# Patient Record
Sex: Female | Born: 1940
Health system: Southern US, Community
[De-identification: ages and names within clinical notes are randomized; demographics above are authoritative.]

## PROBLEM LIST (undated history)

## (undated) DIAGNOSIS — E785 Hyperlipidemia, unspecified: Secondary | ICD-10-CM

## (undated) DIAGNOSIS — K219 Gastro-esophageal reflux disease without esophagitis: Secondary | ICD-10-CM

## (undated) DIAGNOSIS — F32A Depression, unspecified: Secondary | ICD-10-CM

## (undated) DIAGNOSIS — I10 Essential (primary) hypertension: Secondary | ICD-10-CM

## (undated) DIAGNOSIS — I341 Nonrheumatic mitral (valve) prolapse: Secondary | ICD-10-CM

## (undated) DIAGNOSIS — F0390 Unspecified dementia without behavioral disturbance: Secondary | ICD-10-CM

## (undated) DIAGNOSIS — F329 Major depressive disorder, single episode, unspecified: Secondary | ICD-10-CM

## (undated) DIAGNOSIS — Z8489 Family history of other specified conditions: Secondary | ICD-10-CM

## (undated) HISTORY — PX: TONSILLECTOMY: SUR1361

## (undated) HISTORY — PX: CHOLECYSTECTOMY: SHX55

## (undated) HISTORY — PX: ABDOMINAL HYSTERECTOMY: SHX81

---

## 1998-10-22 ENCOUNTER — Other Ambulatory Visit: Admission: RE | Admit: 1998-10-22 | Discharge: 1998-10-22 | Payer: Self-pay | Admitting: Gynecology

## 1999-10-15 ENCOUNTER — Other Ambulatory Visit: Admission: RE | Admit: 1999-10-15 | Discharge: 1999-10-15 | Payer: Self-pay | Admitting: Gynecology

## 1999-12-07 ENCOUNTER — Encounter: Payer: Self-pay | Admitting: Gynecology

## 1999-12-07 ENCOUNTER — Encounter: Admission: RE | Admit: 1999-12-07 | Discharge: 1999-12-07 | Payer: Self-pay | Admitting: Gynecology

## 2000-11-03 ENCOUNTER — Other Ambulatory Visit: Admission: RE | Admit: 2000-11-03 | Discharge: 2000-11-03 | Payer: Self-pay | Admitting: Gynecology

## 2000-12-07 ENCOUNTER — Encounter: Admission: RE | Admit: 2000-12-07 | Discharge: 2000-12-07 | Payer: Self-pay | Admitting: Gynecology

## 2000-12-07 ENCOUNTER — Encounter: Payer: Self-pay | Admitting: Gynecology

## 2000-12-30 ENCOUNTER — Ambulatory Visit (HOSPITAL_COMMUNITY): Admission: RE | Admit: 2000-12-30 | Discharge: 2000-12-30 | Payer: Self-pay | Admitting: Gastroenterology

## 2001-11-08 ENCOUNTER — Other Ambulatory Visit: Admission: RE | Admit: 2001-11-08 | Discharge: 2001-11-08 | Payer: Self-pay | Admitting: Gynecology

## 2001-12-14 ENCOUNTER — Encounter: Payer: Self-pay | Admitting: Gynecology

## 2001-12-14 ENCOUNTER — Encounter: Admission: RE | Admit: 2001-12-14 | Discharge: 2001-12-14 | Payer: Self-pay | Admitting: Gynecology

## 2002-11-19 ENCOUNTER — Other Ambulatory Visit: Admission: RE | Admit: 2002-11-19 | Discharge: 2002-11-19 | Payer: Self-pay | Admitting: Gynecology

## 2002-12-28 ENCOUNTER — Encounter: Payer: Self-pay | Admitting: Gynecology

## 2002-12-28 ENCOUNTER — Encounter: Admission: RE | Admit: 2002-12-28 | Discharge: 2002-12-28 | Payer: Self-pay | Admitting: Gynecology

## 2003-12-05 ENCOUNTER — Other Ambulatory Visit: Admission: RE | Admit: 2003-12-05 | Discharge: 2003-12-05 | Payer: Self-pay | Admitting: Gynecology

## 2003-12-31 ENCOUNTER — Encounter: Admission: RE | Admit: 2003-12-31 | Discharge: 2003-12-31 | Payer: Self-pay | Admitting: Gynecology

## 2004-04-05 ENCOUNTER — Encounter: Admission: RE | Admit: 2004-04-05 | Discharge: 2004-04-05 | Payer: Self-pay | Admitting: Neurology

## 2004-04-11 ENCOUNTER — Ambulatory Visit: Payer: Self-pay | Admitting: Internal Medicine

## 2004-04-11 ENCOUNTER — Inpatient Hospital Stay (HOSPITAL_COMMUNITY): Admission: EM | Admit: 2004-04-11 | Discharge: 2004-04-13 | Payer: Self-pay | Admitting: Emergency Medicine

## 2004-04-11 ENCOUNTER — Ambulatory Visit: Payer: Self-pay | Admitting: Cardiology

## 2004-06-01 ENCOUNTER — Ambulatory Visit (HOSPITAL_COMMUNITY): Admission: RE | Admit: 2004-06-01 | Discharge: 2004-06-01 | Payer: Self-pay | Admitting: Gastroenterology

## 2004-12-08 ENCOUNTER — Other Ambulatory Visit: Admission: RE | Admit: 2004-12-08 | Discharge: 2004-12-08 | Payer: Self-pay | Admitting: Gynecology

## 2004-12-22 ENCOUNTER — Encounter: Admission: RE | Admit: 2004-12-22 | Discharge: 2004-12-22 | Payer: Self-pay | Admitting: Gynecology

## 2005-03-21 ENCOUNTER — Emergency Department (HOSPITAL_COMMUNITY): Admission: EM | Admit: 2005-03-21 | Discharge: 2005-03-21 | Payer: Self-pay | Admitting: Emergency Medicine

## 2006-04-07 ENCOUNTER — Other Ambulatory Visit: Admission: RE | Admit: 2006-04-07 | Discharge: 2006-04-07 | Payer: Self-pay | Admitting: Gynecology

## 2007-03-05 ENCOUNTER — Emergency Department (HOSPITAL_COMMUNITY): Admission: EM | Admit: 2007-03-05 | Discharge: 2007-03-05 | Payer: Self-pay | Admitting: Emergency Medicine

## 2007-04-25 ENCOUNTER — Encounter: Admission: RE | Admit: 2007-04-25 | Discharge: 2007-04-25 | Payer: Self-pay | Admitting: Gynecology

## 2007-06-27 ENCOUNTER — Encounter: Admission: RE | Admit: 2007-06-27 | Discharge: 2007-06-27 | Payer: Self-pay | Admitting: Neurology

## 2007-08-14 ENCOUNTER — Encounter: Admission: RE | Admit: 2007-08-14 | Discharge: 2007-08-14 | Payer: Self-pay | Admitting: Neurology

## 2007-10-13 ENCOUNTER — Ambulatory Visit (HOSPITAL_COMMUNITY): Admission: RE | Admit: 2007-10-13 | Discharge: 2007-10-13 | Payer: Self-pay | Admitting: Neurology

## 2008-04-26 ENCOUNTER — Encounter: Admission: RE | Admit: 2008-04-26 | Discharge: 2008-04-26 | Payer: Self-pay | Admitting: Internal Medicine

## 2009-04-04 ENCOUNTER — Emergency Department (HOSPITAL_COMMUNITY): Admission: EM | Admit: 2009-04-04 | Discharge: 2009-04-04 | Payer: Self-pay | Admitting: Family Medicine

## 2010-01-24 ENCOUNTER — Encounter: Admission: RE | Admit: 2010-01-24 | Discharge: 2010-01-24 | Payer: Self-pay | Admitting: Orthopedic Surgery

## 2010-03-29 ENCOUNTER — Encounter: Payer: Self-pay | Admitting: Internal Medicine

## 2010-07-24 NOTE — Discharge Summary (Signed)
Kristine Cox, Kristine Cox               ACCOUNT NO.:  [id number]   MEDICAL RECORD NO.:  [id number]          PATIENT TYPE:  INP   LOCATION:  3730                         FACILITY:  [facility]   PHYSICIAN:  Alvester Morin, M.D.  DATE OF BIRTH:  [birth year]   DATE OF ADMISSION:  04/11/2004  DATE OF DISCHARGE:  04/13/2004                                 DISCHARGE SUMMARY   DISCHARGE DIAGNOSES:  1.  Atypical chest pain, myocardial infarction ruled out with Cardiolite.  2.  Hypertension.  3.  Cough.  4.  Status post hysterectomy for acute __________ in 1994.  5.  Status post cholecystectomy 20 years ago.  6.  Status post tonsillectomy.   DISCHARGE MEDICATIONS:  1.  Diovan 40 mg daily.  2.  Prilosec 40 mg daily.  3.  Prozac 40 mg daily.  4.  Aspirin 325 mg daily.  5.  Tussionex 1 tablespoon twice daily.   DISPOSITION/FOLLOWUP:  [name] is discharged in stable condition with no  complaints of chest pain for 48 hours during her admission in the hospital.  She follows up with Dr. Egbert Garibaldi on Thursday, February 9th, at 1:30 p.m.  At  this time, Dr. Egbert Garibaldi is to order a CMET on her and follow up on abnormal  liver function tests, to see if they have reversed and then start her on  anticholesterol medicine because anticholesterol treatment can alter the  liver function and wanted a clear difference between the two.   PROCEDURES PERFORMED:  Patient had an EKG in the ER on the day of admission,  which showed bradycardia at normal intervals, normal axis, and no ST/T wave  changes.   Patient had a CT scan to rule out PE.   CONSULTS:  There were no consults on this patient.   HISTORY OF PRESENT ILLNESS:  [name] is a 70 year old white female with  cardiac risk factors of hypertension, age, history of remote tobacco abuse,  unknown lipid status and family history of coronary artery disease but not  at a young age, who presented to the ER with a complaint of episode of chest  pain at work.  The  pain started around 11 a.m. in her left shoulder and was  associated with diaphoresis and a feeling of passing out.  She sat herself  and was brought to the ED.  The chest pain lasted for around three hours and  disappeared after she came to the ED without any treatment.  Patient had a  similar episode two days ago when she had left-sided chest pain radiating to  her left arm, associated with diaphoresis.  Not associated with any  shortness of breath, nausea, vomiting, palpitations, or any visual changes.  Disappeared 6-7 minutes after taking two tablets of aspirin.  Patient has  had a complaint of cough for 10 days, producing white sputum.  No complaint  of fever, night sweats, loss of appetite.   LABS ON ADMISSION:  Sodium 139, potassium 4.1, chloride 104, bicarb 27, BUN  7, creatinine 0.8, glucose 113.  Hemoglobin 13.5, hematocrit 13.2, WBC 0.3,  platelets 276.  Bilirubin 0.3, alk phos 131, SGOT 103, SGPT 54, protein 6.6,  albumin 3.5, calcium 9.  Coags were normal.  UA was negative.  Urine drug  screen was positive for opiates and benzodiazepines.  Alcohol level was 3.  Cardiac enzymes x3 was normal.  D-dimer was 0.67.   ASSESSMENT/PLAN:  1.  Chest pain:  The patient has multiple cardiac risk factors, listing them      to begin with age.  Patient is menopausal.  Hypertension.      Hyperlipidemia with a family history of coronary artery disease but not      at a young age.  Remote tobacco abuse.  Patient was admitted to rule out      MI.  Cardiac enzymes x3 were negative.  EKG was normal to begin with.      The patient did get a cardiac consult by Dr. Jens Som, who thought it      was needed to work her up with a Cardiolite stress test.  She got the      Cardiolite stress test on February 6th, which was normal.  Final films      are pending at this time.  Patient did not have any episode of chest      pain during her entire admission in the hospital; hence, patient was      discharged  with a follow-up appointment with her primary doctor.  2.  Hypertension:  The patient's blood pressure was normal during her entire      stay on Diovan 40 mg daily.  3.  Hyperlipidemia:  Patient came in with an unknown lipid status but on      admission, her cholesterol was 195, HDL 27, triglycerides 316, LDL 105.      Patient definitely needs anticholesterol treatment.  Niacin would be      best for her, since her HDL is so low and triglycerides is elevated, but      with her abnormal liver functions, I did not find it right to start her      on antilipid treatment, which could itself alter liver functions.  My      plan is for the patient to follow up with her primary doctor, do a      repeat CMET, find out the condition of her liver enzymes, and start      antilipid treatment after that.  4.  Abnormal liver function tests:  The patient has OT/PT split with SGOT      103 and SGPT 54, alk phos 131.  Her alcohol level on admission was 3.      So this is an abnormal liver function test with split in OT/PT most      likely is alcohol in origin.  Did go ahead and order hepatitis panel,      which has been negative.  Will follow up on that and inform Dr. Egbert Garibaldi      about this.  5.  Depression:  Patient is on Prozac 40 daily.  6.  Gastroesophageal reflux disease:  Patient is on Prilosec 40 mg daily.   LABS ON DISCHARGE:  Hemoglobin 13.1, WBC 7.7, TSH 1.085.   Patient is discharged in stable condition.      SP/MEDQ  D:  04/13/2004  T:  04/13/2004  Job:  [id number]   cc:   Dr. Dulce Sellar, cardiologist   Steele Berg Phifer, M.D.  [address]  [place]  [place] [address]  Fax: [phone]  Cheri Rous  [address]  [place]  [place] [address]  Fax: [phone]

## 2010-07-24 NOTE — Procedures (Signed)
[facility]  Patient:    Kristine Cox, Kristine Cox Visit Number: [id number] MRN: [id number]          Service Type: END Location: ENDO Attending Physician:  Rich Brave Proc. Date: 12/30/00 Admit Date:  12/30/2000 Discharge Date: 12/30/2000   CC:         Leroy Sea., M.D.   Procedure Report  PROCEDURE:  Colonoscopy.  INDICATION:  Prior history of colonic adenomas, plus a family history of colon cancer, in this 70 year old white female last colonoscoped five years ago.  FINDINGS:  Small vascular malformation in the cecum.  DESCRIPTION OF PROCEDURE:  The nature, purpose, and risks of the procedure were familiar to the patient from prior examinations, and she provided written consent.  Sedation for this procedure and the upper endoscopy which preceded it totaled fentanyl 75 mcg and Versed 5 mg IV without arrhythmias or desaturation.  The Olympus adult video colonoscope was advanced without too much difficulty, just a little bit of looping, to the cecum, and pullback was then performed.  At the base of the cecum was a 2-3 mm vascular malformation which was not bleeding.  This was otherwise a normal examination.  No polyps, cancer, colitis, or diverticular disease were observed. Retroflexion showed moderate internal hemorrhoids.  No biopsies were obtained.  The patient tolerated the procedure well, and there were no apparent complications.  IMPRESSION:  Small cecal vascular malformation, otherwise normal exam in a patient with a family history of colon cancer and prior history of colonic adenomas having been removed.  PLAN:  Follow-up colonoscopy five years. Attending Physician:  Rich Brave DD:  12/30/00 TD:  01/01/01 Job: [id number]

## 2010-07-24 NOTE — Consult Note (Signed)
Kristine Cox, Kristine Cox               ACCOUNT NO.:  [id number]   MEDICAL RECORD NO.:  [id number]          PATIENT TYPE:  INP   LOCATION:  3733                         FACILITY:  [facility]   PHYSICIAN:  Olga Millers, M.D.   DATE OF BIRTH:  Nov 17, 1940   DATE OF CONSULTATION:  04/12/2004  DATE OF DISCHARGE:                                   CONSULTATION   SUMMARY OF HISTORY:  Kristine Cox is a 70 year old white female who was  transported via EMS to [facility] Room secondary to chest  discomfort and admitted by [facility] and we were asked to consult  in regards to chest discomfort.  Kristine Cox states that Friday around 6 p.m.  while she was resting to get ready for her third shift at work, she states  that suddenly she felt faint.  This was followed by profuse diaphoresis with  left arm aching, radiating into her left shoulder and chest unassociated  with shortness of breath, nausea or vomiting.  She was unable to rest and  could not get comfortable.  She states her discomfort was a 10 on a scale of  0-10.  She called to her husband and took two aspirin and the discomfort  eased within 10 minutes.  She had residual fatigue.  She worked Friday night  without difficulty and was able to rest Saturday.  She stated that she felt  okay throughout Saturday and Friday evening, but felt strange.  On  Saturday evening after about an hour being at work, she developed the same  symptoms.  She saw the EMT who took her to a nursing station and she again  took two aspirin with improvement of her discomfort.  They called EMS for  transportation.  On EMS' arrival, blood pressure was 112/66, pulse 64,  respirations 16 and in sinus rhythm.  She was placed on the monitor and O2  and transported.   Since admission, enzymes and EKGs have been negative for a myocardial  infarction.  Her hemoglobin is 13.5 and hematocrit 39.2, platelets 276,000,  WBC is 8.3.  Sodium 139, potassium 4.1, BUN 7,  creatinine 0.8, glucose 101.  Her AST, ALT and alkaline phosphatase were all elevated at 103, 54, and 131,  respectively.  BNP was normal at 72.3, D-dimer was elevated at 0.67 and a  chest CT was negative for pulmonary embolism.  Chest x-ray shows bilateral  basilar atelectasis.  Drug screen was positive for opiates, ETOH level was  3.   Kristine Cox describes prior occurrences she thinks within the last year but  she cannot be specific because of the infrequency.  She does recall one  episode and she attributes this specifically as to needing something to  eat.  She also notes that she was recently treated for bronchitis by her  primary care physician approximately 2 weeks ago.  She states that she  received a Phenergan shot, an inhaler and a cough medication.   ALLERGIES:  NO KNOWN DRUG ALLERGIES.   MEDICATIONS PRIOR TO ADMISSION:  1.  Diovan 160 mg daily.  2.  Prilosec  20 mg daily.  3.  Prozac 40 mg daily.  4.  Aspirin 81 mg daily.  5.  Excedrin p.r.n.  6.  __________  25 mg two times a week.  7.  Vivelle dot 0.05 mg two times a week.   Her history is notable for hypertension.  She states she thinks it was in  the 1970s when she was diagnosed with mitral valve prolapse.  She had an  echocardiogram at that time in [place].  When she moved here, she followed  up with Dr. Alanda Amass and then when she moved to [place] with Dr. Dulce Sellar.  Since her initial evaluation in the 70s, she has not had any follow-up  echocardiograms and she denies any history of stress test or cardiac  catheterizations.  She denies any history of diabetes, myocardial  infarction, CVA, COPD, bleeding dyscrasias, thyroid dysfunction, she does  not know her cholesterol status.  Last colonoscopy was in October 2002, she  had a small cecal vascular malformation.  EGD in October 2002 showed GERD.   SURGICAL HISTORY:  Notable for a hysterectomy in 1994, bilateral tubal  ligation, cholecystectomy, T&A, bladder  tack.   SOCIAL HISTORY:  She resides in [place] with her husband.  She has been  employed by [employer] for the last 18 years and is a Scientist, clinical (histocompatibility and immunogenetics).  She has two daughters alive and well.  She quit smoking 17  years ago, prior to that one pack per day for 19 years.  She denies any  alcohol, drugs, herbal medications, specific diet, or regular exercise.   FAMILY HISTORY:  Notable for the death of her mother at the age of 44 with  melanoma, her father died at the age of 96 with CVA and history of coronary  artery disease.  She has 11 brothers and sisters, three of whom have  diabetes, several have died secondary to different types of cancer.  She has  one brother in his 61s who is post bypass surgery.   REVIEW OF SYSTEMS:  Notable for occasional chills, upper dentures, glasses,  cough productive of yellow sputum, stress incontinence, menopause, chronic  vomiting, GERD symptoms.   CURRENT MEDICATIONS:  1.  Aspirin 325 mg daily.  2.  Prozac 40 mg daily.  3.  Protonix 40 mg daily.  4.  Lovenox 40 mg daily.  5.  Diovan 40 mg daily.  6.  Tussionex b.i.d.   PHYSICAL EXAMINATION:  GENERAL:  Well-developed, well-nourished pleasant  white female in no apparent distress.  During our interview and on exam she  is frequently coughing.  VITAL SIGNS:  Temperature 98, blood pressure 116/62, pulse 76, respirations  20.  HEENT:  Unremarkable except for upper dentures.  NECK:  Supple, without thyromegaly, adenopathy, JVD or carotid bruits.  CHEST:  Symmetrical excursion.  Lung sounds were diminished but clear to  auscultation.  ABDOMEN:  Obese, bowel sounds present, without organomegaly, masses or  tenderness.  EXTREMITIES:  Negative clubbing, cyanosis or edema.  All peripheral pulses  were symmetrical and intact without femoral or abdominal bruits.  NEUROLOGIC:  Unremarkable.  SKIN/INTEGUMENT:  Intact.   IMPRESSION: 1.  Atypical chest discomfort.  She has ruled out for  a myocardial      infarction by enzymes and electrocardiograms.  2.  Recent bronchitis.  3.  Elevated liver function tests, unknown etiology.  4.  History as previously mentioned.   RECOMMENDATIONS:  Dr. Jens Som reviewed the patient's history, physical exam  and medications.  He agrees that the discomfort is somewhat atypical and  would recommend a stress Cardiolite in the morning prior to dismissal from  the hospital.  If her Myoview is negative, she may be discharged home from  our standpoint and to follow up with Dr. Dulce Sellar at discharge.  She does need  evaluation of her elevated LFTs.      EW/MEDQ  D:  04/12/2004  T:  04/13/2004  Job:  [id number]   cc:   Cheri Rous  [address] [place]  [place] [address]  Fax: [phone]   Dr. Dulce Sellar  [facility]

## 2010-07-24 NOTE — Procedures (Signed)
[facility]  Patient:    Kristine Cox, Kristine Cox Visit Number: [id number] MRN: [id number]          Service Type: END Location: ENDO Attending Physician:  Rich Brave Proc. Date: 12/30/00 Admit Date:  12/30/2000 Discharge Date: 12/30/2000   CC:         Leroy Sea., M.D.   Procedure Report  PROCEDURE:  Upper endoscopy.  INDICATION:  A 70 year old female with recurrent reflux symptoms, responding to Prilosec.  Last endoscopy was about 10 years ago.  FINDINGS:  No evidence of Barretts.  Slight bile reflux and gastric erythema.  DESCRIPTION OF PROCEDURE:  The nature, purpose, and risks of the procedure were familiar to the patient from prior examination, and she provided written consent.  Sedation was fentanyl 50 mcg and Versed 4 mg IV without arrhythmias or desaturation.  The Olympus video endoscope was passed under direct vision. The vocal cords looked completely normal.  The esophagus was readily entered and had completely normal mucosa down to the squamocolumnar junction, without evidence of free gastroesophageal reflux, Barretts esophagus, reflux esophagitis, varices, infection, or neoplasia.  No ring, stricture, or hiatal hernia was appreciated.  The stomach was entered.  It contained a small amount of bile, and the gastric mucosa was slightly erythematous and pink, although there was no florid gastritis and no erosions, ulcers, polyps, or masses were observed including retroflexed view of the proximal stomach.  The pylorus, duodenal bulb, and second duodenum looked normal.  The scope was removed from the patient who tolerated the procedure well and without apparent complication.  No biopsies were obtained.  IMPRESSION: 1. No evidence of adverse sequela of reflux symptoms such as Barretts    esophagus, stricture, or reflux esophagitis. 2. Slight bile reflux and possible bile reflux gastritis.  PLAN:  Continue Prilosec since  the patient is asymptomatic on it.  No follow-up endoscopy is anticipated. Attending Physician:  Rich Brave DD:  12/30/00 TD:  01/01/01 Job: [id number]

## 2010-07-24 NOTE — Op Note (Signed)
Kristine Cox, Kristine Cox               ACCOUNT NO.:  [id number]   MEDICAL RECORD NO.:  [id number]          PATIENT TYPE:  AMB   LOCATION:  ENDO                         FACILITY:  [facility]   PHYSICIAN:  Bernette Redbird, M.D.   DATE OF BIRTH:  08/06/1940   DATE OF PROCEDURE:  DATE OF DISCHARGE:                                 OPERATIVE REPORT   PROCEDURE:  Upper endoscopy.   INDICATIONS:  A 70 year old with recurring chest pain of apparent noncardiac  origin.   FINDINGS:  Normal exam.   PROCEDURE:  The patient provided written consent for the procedure. Sedation  was fentanyl 50 mcg and Versed 5 mg IV without arrhythmias or desaturation.  The Olympus small-caliber adult video endoscope was passed under direct  vision, entering the esophagus quite easily.   The esophagus was endoscopically normal. I did not see any reflux  esophagitis, Barrett's esophagus, varices, infection, neoplasia, ring,  stricture, or hiatal hernia. I did not see any obvious motility disturbance  to suggest esophageal spasm.   The stomach was entered. It contained no significant residual. I saw no  evidence of gastritis, erosions, ulcers, polyps, masses, or other  abnormalities including a retroflex view of the cardia. The pylorus,  duodenal bulb, and second duodenum looked normal.   The scope was removed from the patient who tolerated the procedure well; and  there no apparent complications. No biopsies were obtained.   IMPRESSION:  Normal endoscopy. No source of chest pain endoscopically  evident.   PLAN:  I will continue PPI therapy for now. She is currently taking double  dose, and this is probably reasonable to continue for the next couple of  months, after which I would probably try stepping it down to once daily  dosing.      RB/MEDQ  D:  06/01/2004  T:  06/01/2004  Job:  [id number]   cc:   Norman Herrlich, M.D.  [address]  [place]   [place]  [address]

## 2010-12-11 LAB — I-STAT 8, (EC8 V) (CONVERTED LAB)
BUN: 16
Bicarbonate: 26.6 — ABNORMAL HIGH
Glucose, Bld: 123 — ABNORMAL HIGH
Hemoglobin: 16 — ABNORMAL HIGH
Potassium: 4.5
Sodium: 140

## 2010-12-11 LAB — POCT I-STAT CREATININE: Creatinine, Ser: 1

## 2012-02-29 ENCOUNTER — Emergency Department (HOSPITAL_COMMUNITY)
Admission: EM | Admit: 2012-02-29 | Discharge: 2012-02-29 | Disposition: A | Discharge status: Home / Self Care | Payer: Medicare HMO | Attending: Emergency Medicine | Admitting: Emergency Medicine

## 2012-02-29 ENCOUNTER — Emergency Department (HOSPITAL_COMMUNITY): Payer: Medicare HMO

## 2012-02-29 ENCOUNTER — Encounter (HOSPITAL_COMMUNITY): Payer: Self-pay | Admitting: Emergency Medicine

## 2012-02-29 DIAGNOSIS — F3289 Other specified depressive episodes: Secondary | ICD-10-CM | POA: Insufficient documentation

## 2012-02-29 DIAGNOSIS — S79919A Unspecified injury of unspecified hip, initial encounter: Secondary | ICD-10-CM | POA: Insufficient documentation

## 2012-02-29 DIAGNOSIS — E785 Hyperlipidemia, unspecified: Secondary | ICD-10-CM | POA: Insufficient documentation

## 2012-02-29 DIAGNOSIS — F0390 Unspecified dementia without behavioral disturbance: Secondary | ICD-10-CM | POA: Insufficient documentation

## 2012-02-29 DIAGNOSIS — S82899A Other fracture of unspecified lower leg, initial encounter for closed fracture: Secondary | ICD-10-CM | POA: Insufficient documentation

## 2012-02-29 DIAGNOSIS — Y929 Unspecified place or not applicable: Secondary | ICD-10-CM | POA: Insufficient documentation

## 2012-02-29 DIAGNOSIS — F329 Major depressive disorder, single episode, unspecified: Secondary | ICD-10-CM | POA: Insufficient documentation

## 2012-02-29 DIAGNOSIS — Z79899 Other long term (current) drug therapy: Secondary | ICD-10-CM | POA: Insufficient documentation

## 2012-02-29 DIAGNOSIS — I10 Essential (primary) hypertension: Secondary | ICD-10-CM | POA: Insufficient documentation

## 2012-02-29 DIAGNOSIS — W19XXXA Unspecified fall, initial encounter: Secondary | ICD-10-CM | POA: Insufficient documentation

## 2012-02-29 DIAGNOSIS — Y939 Activity, unspecified: Secondary | ICD-10-CM | POA: Insufficient documentation

## 2012-02-29 DIAGNOSIS — S0990XA Unspecified injury of head, initial encounter: Secondary | ICD-10-CM | POA: Insufficient documentation

## 2012-02-29 DIAGNOSIS — Z7982 Long term (current) use of aspirin: Secondary | ICD-10-CM | POA: Insufficient documentation

## 2012-02-29 HISTORY — DX: Hyperlipidemia, unspecified: E78.5

## 2012-02-29 HISTORY — DX: Depression, unspecified: F32.A

## 2012-02-29 HISTORY — DX: Essential (primary) hypertension: I10

## 2012-02-29 HISTORY — DX: Major depressive disorder, single episode, unspecified: F32.9

## 2012-02-29 NOTE — ED Provider Notes (Signed)
History     CSN: [id number]  Arrival date & time 02/29/12  1122   First MD Initiated Contact with Patient 02/29/12 1128      Chief Complaint  Patient presents with  . Ankle Pain    (Consider location/radiation/quality/duration/timing/severity/associated sxs/prior treatment) HPI 71 year old female presents the emergency department for ankle fracture.  Patient was seen and evaluated at [facility] last Friday after a fall.  She's unsure of the fracture but does know that she needs ORIF.  She has been evaluated in outpatient followup by Dr. Carola Frost.  Patient removed her ankle splint in her sleep last night.  She also has left hip pain and headache.  Her daughter states she's had increased confusion on top of her baseline dementia.  Patient does not remember hitting her head or losing consciousness but she does have a small bruise on the posterior occiput.  She did not have CT of the head or left hip x-ray.  Denies fevers, chills, myalgias, arthralgias. Denies DOE, SOB, chest tightness or pressure, radiation to left arm, jaw or back, or diaphoresis. Denies dysuria, flank pain, suprapubic pain, frequency, urgency, or hematuria. Denies headaches, light headedness, weakness, visual disturbances. Denies abdominal pain, nausea, vomiting, diarrhea or constipation.   Past Medical History  Diagnosis Date  . Hypertension   . Depression   . Hyperlipidemia     Past Surgical History  Procedure Date  . Tonsillectomy   . Abdominal hysterectomy   . Cholecystectomy     No family history on file.  History  Substance Use Topics  . Smoking status: Former Games developer  . Smokeless tobacco: Not on file  . Alcohol Use: No    OB History    Grav Para Term Preterm Abortions TAB SAB Ect Mult Living                  Review of Systems Ten systems reviewed and are negative for acute change, except as noted in the HPI.   Allergies  Review of patient's allergies indicates no known  allergies.  Home Medications   Current Outpatient Rx  Name  Route  Sig  Dispense  Refill  . ASPIRIN EC 81 MG PO TBEC   Oral   Take 81 mg by mouth daily.         . DONEPEZIL HCL 10 MG PO TABS   Oral   Take 10 mg by mouth daily.         . FENOFIBRATE 145 MG PO TABS   Oral   Take 145 mg by mouth daily.         Marland Kitchen FLUOXETINE HCL 20 MG PO CAPS   Oral   Take 20 mg by mouth daily.         . OXYCODONE-ACETAMINOPHEN 10-325 MG PO TABS   Oral   Take 1 tablet by mouth every 4 (four) hours as needed. For pain         . PRAVASTATIN SODIUM 40 MG PO TABS   Oral   Take 40 mg by mouth daily.           BP 130/67  Pulse 68  Temp 98.3 F (36.8 C) (Oral)  Resp 18  SpO2 95%  Physical Exam  Constitutional: She is oriented to person, place, and time. She appears well-developed and well-nourished. No distress.  HENT:  Head: Normocephalic and atraumatic.  Eyes: Conjunctivae normal are normal. No scleral icterus.  Neck: Normal range of motion.  Cardiovascular: Normal rate, regular rhythm and  normal heart sounds.  Exam reveals no gallop and no friction rub.   No murmur heard. Pulmonary/Chest: Effort normal and breath sounds normal. No respiratory distress.  Abdominal: Soft. Bowel sounds are normal. She exhibits no distension and no mass. There is no tenderness. There is no guarding.  Musculoskeletal:       TTP left hip and glute. Patient has left foot and ankle bruising and swelling.  There is bruising to the anterior shin.  Neurovascularly intact  Neurological: She is alert and oriented to person, place, and time.  Skin: Skin is warm and dry. She is not diaphoretic.    ED Course  Procedures (including critical care time)  Labs Reviewed - No data to display Dg Hip Complete Left  02/29/2012  *RADIOLOGY REPORT*  Clinical Data: Fall 4 days ago back pain, hip pain  LEFT HIP - COMPLETE 2+ VIEW  Comparison: None.  Findings: Three views of the left hip submitted.  No acute  fracture or subluxation.  Diffuse osteopenia.  IMPRESSION: No acute fracture or subluxation.   Original Report Authenticated By: Natasha Mead, M.D.    Ct Head Wo Contrast  02/29/2012  *RADIOLOGY REPORT*  Clinical Data:  Memory loss, recent falls  CT HEAD WITHOUT CONTRAST  Technique:  Contiguous axial images were obtained from the base of the skull through the vertex without contrast  Comparison:  06/27/2007  Findings:  The brain has a normal appearance without evidence for hemorrhage, acute infarction, hydrocephalus, or mass lesion.  There is no extra axial fluid collection.  The skull and paranasal sinuses are normal.  IMPRESSION: Normal CT of the head without contrast.   Original Report Authenticated By: Judie Petit. Shick, M.D.      1. Ankle fracture   2. Fall       MDM  3:09 PM BP 130/67  Pulse 68  Temp 98.3 F (36.8 C) (Oral)  Resp 18  SpO2 95% Patient CT of the head and left hip x-ray are negative.  We'll discharge the patient with new splint to the left foot.  Patient is unable to use crutches and has walker at home.  I have advised the family that they should call Dr. Carola Frost who is managing the patient's orthopedic issues.  If they need more assistance with ambulation.  Patient has pain medication.  I have advised him not to give Tylenol with Percocet.  He may use Advil and supportive care. Discussed reasons to seek immediate care. Patient expresses understanding and agrees with plan.        Arthor Captain, PA-C 02/29/12 1510

## 2012-02-29 NOTE — ED Notes (Signed)
Pt presenting to ed with c/o falling and fracturing left ankle x 4 days ago. Pt received splint cast on leg yesterday at which pt took splint off in the middle of last night and now swelling and pain is worse. Pt also with c/o left buttock pain. Pt states she was originally seen at [facility]. Pt's daughter states pt has also been complaining of head pain.

## 2012-02-29 NOTE — ED Provider Notes (Signed)
Medical screening examination/treatment/procedure(s) were performed by non-physician practitioner and as supervising physician I was immediately available for consultation/collaboration.   Alvia Tory B. Bernette Mayers, MD 02/29/12 [phone]

## 2012-03-15 ENCOUNTER — Encounter (HOSPITAL_COMMUNITY): Payer: Self-pay | Admitting: Pharmacy Technician

## 2012-03-16 ENCOUNTER — Other Ambulatory Visit: Payer: Self-pay | Admitting: Orthopedic Surgery

## 2012-03-20 ENCOUNTER — Encounter (HOSPITAL_COMMUNITY)
Admission: RE | Admit: 2012-03-20 | Discharge: 2012-03-20 | Disposition: A | Discharge status: Home / Self Care | Payer: Medicare HMO | Source: Ambulatory Visit | Attending: Orthopedic Surgery | Admitting: Orthopedic Surgery

## 2012-03-20 ENCOUNTER — Encounter (HOSPITAL_COMMUNITY): Payer: Self-pay

## 2012-03-20 LAB — URINALYSIS, ROUTINE W REFLEX MICROSCOPIC
Nitrite: NEGATIVE
Specific Gravity, Urine: 1.005 (ref 1.005–1.030)
Urobilinogen, UA: 0.2 mg/dL (ref 0.0–1.0)
pH: 6.5 (ref 5.0–8.0)

## 2012-03-20 LAB — SURGICAL PCR SCREEN
MRSA, PCR: NEGATIVE
Staphylococcus aureus: NEGATIVE

## 2012-03-20 LAB — URINE MICROSCOPIC-ADD ON

## 2012-03-20 LAB — CBC
Platelets: 351 10*3/uL (ref 150–400)
RBC: 4.76 MIL/uL (ref 3.87–5.11)
RDW: 13.7 % (ref 11.5–15.5)
WBC: 9.3 10*3/uL (ref 4.0–10.5)

## 2012-03-20 LAB — BASIC METABOLIC PANEL
Calcium: 9.7 mg/dL (ref 8.4–10.5)
Creatinine, Ser: 0.65 mg/dL (ref 0.50–1.10)
GFR calc Af Amer: >90 mL/min (ref >90)
Sodium: 139 mEq/L (ref 135–145)

## 2012-03-20 MED ORDER — CHLORHEXIDINE GLUCONATE 4 % EX LIQD: 60.0000 mL | Freq: Once | CUTANEOUS | Status: DC

## 2012-03-20 MED ORDER — CEFAZOLIN SODIUM-DEXTROSE 2-3 GM-% IV SOLR
2.0000 g | INTRAVENOUS | Status: AC
  Administered 2012-03-21: 2 g via INTRAVENOUS
  Filled 2012-03-20: qty 50

## 2012-03-20 NOTE — Progress Notes (Signed)
Kristine Cox at Dr August Saucer 's office made aware of abnormal urine specimen, stated she would inform Dr August Saucer.

## 2012-03-20 NOTE — Pre-Procedure Instructions (Signed)
Kristine Cox  03/20/2012   Your procedure is scheduled on:  Tuesday March 21, 2012  Report to [facility] at 0845 AM.  Call this number if you have problems the morning of surgery: [phone]   Remember:   Do not eat food or drink liquids after midnight.   Take these medicines the morning of surgery with A SIP OF WATER: Donepezil[ Aricept], Fluoxetine[Prozac] and Oxycodone[Percocet] if needed   Do not wear jewelry, make-up or nail polish.  Do not wear lotions, powders, or perfumes. You may wear deodorant.  Do not shave 48 hours prior to surgery.   Do not bring valuables to the hospital.  Contacts, dentures or bridgework may not be worn into surgery.  Leave suitcase in the car. After surgery it may be brought to your room.  For patients admitted to the hospital, checkout time is 11:00 AM the day of  discharge.   Patients discharged the day of surgery will not be allowed to drive  home.  Name and phone number of your driver:   Special Instructions: Shower using CHG 2 nights before surgery and the night before surgery.  If you shower the day of surgery use CHG.  Use special wash - you have one bottle of CHG for all showers.  You should use approximately 1/3 of the bottle for each shower.   Please read over the following fact sheets that you were given: Pain Booklet, Coughing and Deep Breathing, MRSA Information and Surgical Site Infection Prevention

## 2012-03-21 ENCOUNTER — Ambulatory Visit (HOSPITAL_COMMUNITY): Payer: Medicare HMO | Admitting: Anesthesiology

## 2012-03-21 ENCOUNTER — Encounter (HOSPITAL_COMMUNITY): Payer: Self-pay | Admitting: Anesthesiology

## 2012-03-21 ENCOUNTER — Observation Stay (HOSPITAL_COMMUNITY)
Admission: RE | Admit: 2012-03-21 | Discharge: 2012-03-23 | Disposition: A | Discharge status: Home / Self Care | Payer: Medicare HMO | Source: Ambulatory Visit | Attending: Orthopedic Surgery | Admitting: Orthopedic Surgery

## 2012-03-21 ENCOUNTER — Encounter (HOSPITAL_COMMUNITY): Payer: Self-pay | Admitting: General Practice

## 2012-03-21 ENCOUNTER — Ambulatory Visit (HOSPITAL_COMMUNITY): Payer: Medicare HMO

## 2012-03-21 ENCOUNTER — Observation Stay (HOSPITAL_COMMUNITY): Payer: Medicare HMO

## 2012-03-21 ENCOUNTER — Encounter (HOSPITAL_COMMUNITY)
Admission: RE | Disposition: A | Discharge status: Home / Self Care | Payer: Self-pay | Source: Ambulatory Visit | Attending: Orthopedic Surgery

## 2012-03-21 DIAGNOSIS — Z01812 Encounter for preprocedural laboratory examination: Secondary | ICD-10-CM | POA: Insufficient documentation

## 2012-03-21 DIAGNOSIS — Z01818 Encounter for other preprocedural examination: Secondary | ICD-10-CM | POA: Insufficient documentation

## 2012-03-21 DIAGNOSIS — Z01810 Encounter for preprocedural cardiovascular examination: Secondary | ICD-10-CM | POA: Insufficient documentation

## 2012-03-21 DIAGNOSIS — I10 Essential (primary) hypertension: Secondary | ICD-10-CM | POA: Insufficient documentation

## 2012-03-21 DIAGNOSIS — S82892A Other fracture of left lower leg, initial encounter for closed fracture: Secondary | ICD-10-CM

## 2012-03-21 DIAGNOSIS — W19XXXA Unspecified fall, initial encounter: Secondary | ICD-10-CM | POA: Insufficient documentation

## 2012-03-21 DIAGNOSIS — S82853A Displaced trimalleolar fracture of unspecified lower leg, initial encounter for closed fracture: Principal | ICD-10-CM | POA: Insufficient documentation

## 2012-03-21 DIAGNOSIS — N390 Urinary tract infection, site not specified: Secondary | ICD-10-CM | POA: Insufficient documentation

## 2012-03-21 DIAGNOSIS — Z01811 Encounter for preprocedural respiratory examination: Secondary | ICD-10-CM | POA: Insufficient documentation

## 2012-03-21 HISTORY — PX: ORIF ANKLE FRACTURE: SUR919

## 2012-03-21 HISTORY — DX: Nonrheumatic mitral (valve) prolapse: I34.1

## 2012-03-21 HISTORY — DX: Family history of other specified conditions: Z84.89

## 2012-03-21 HISTORY — PX: ORIF ANKLE FRACTURE: SHX5408

## 2012-03-21 HISTORY — DX: Gastro-esophageal reflux disease without esophagitis: K21.9

## 2012-03-21 LAB — PROTIME-INR: INR: 1.05 (ref 0.00–1.49)

## 2012-03-21 SURGERY — OPEN REDUCTION INTERNAL FIXATION (ORIF) ANKLE FRACTURE
Anesthesia: Regional | Site: Ankle | Laterality: Left | Wound class: Clean

## 2012-03-21 MED ORDER — NEOSTIGMINE METHYLSULFATE 1 MG/ML IJ SOLN
INTRAMUSCULAR | Status: DC | PRN
  Administered 2012-03-21: 4 mg via INTRAVENOUS

## 2012-03-21 MED ORDER — METHOCARBAMOL 100 MG/ML IJ SOLN: 500.0000 mg | Freq: Four times a day (QID) | INTRAVENOUS | Status: DC | PRN

## 2012-03-21 MED ORDER — OXYCODONE HCL 5 MG/5ML PO SOLN: 5.0000 mg | Freq: Once | ORAL | Status: DC | PRN

## 2012-03-21 MED ORDER — LACTATED RINGERS IV SOLN
INTRAVENOUS | Status: DC | PRN
  Administered 2012-03-21 (×3): via INTRAVENOUS

## 2012-03-21 MED ORDER — HYDROMORPHONE HCL PF 1 MG/ML IJ SOLN: 0.2500 mg | INTRAMUSCULAR | Status: DC | PRN

## 2012-03-21 MED ORDER — EPHEDRINE SULFATE 50 MG/ML IJ SOLN
INTRAMUSCULAR | Status: DC | PRN
  Administered 2012-03-21 (×4): 5 mg via INTRAVENOUS

## 2012-03-21 MED ORDER — ROCURONIUM BROMIDE 100 MG/10ML IV SOLN
INTRAVENOUS | Status: DC | PRN
  Administered 2012-03-21: 40 mg via INTRAVENOUS

## 2012-03-21 MED ORDER — ASPIRIN 325 MG PO TABS
325.0000 mg | ORAL_TABLET | Freq: Every day | ORAL | Status: DC
  Administered 2012-03-21 – 2012-03-23 (×3): 325 mg via ORAL
  Filled 2012-03-21 (×4): qty 1

## 2012-03-21 MED ORDER — BUPIVACAINE HCL (PF) 0.5 % IJ SOLN
INTRAMUSCULAR | Status: DC | PRN
  Administered 2012-03-21: 10 mL

## 2012-03-21 MED ORDER — LACTATED RINGERS IV SOLN
INTRAVENOUS | Status: DC
  Administered 2012-03-21: 10:00:00 via INTRAVENOUS

## 2012-03-21 MED ORDER — WARFARIN VIDEO: Freq: Once | Status: DC

## 2012-03-21 MED ORDER — BUPIVACAINE HCL (PF) 0.25 % IJ SOLN
INTRAMUSCULAR | Status: AC
  Filled 2012-03-21: qty 30

## 2012-03-21 MED ORDER — FLUOXETINE HCL 20 MG PO CAPS
20.0000 mg | ORAL_CAPSULE | Freq: Every day | ORAL | Status: DC
  Administered 2012-03-21 – 2012-03-23 (×3): 20 mg via ORAL
  Filled 2012-03-21 (×3): qty 1

## 2012-03-21 MED ORDER — GLYCOPYRROLATE 0.2 MG/ML IJ SOLN
INTRAMUSCULAR | Status: DC | PRN
  Administered 2012-03-21: 0.6 mg via INTRAVENOUS

## 2012-03-21 MED ORDER — CHLORHEXIDINE GLUCONATE 4 % EX LIQD: 60.0000 mL | Freq: Once | CUTANEOUS | Status: DC

## 2012-03-21 MED ORDER — PHENYLEPHRINE HCL 10 MG/ML IJ SOLN
INTRAMUSCULAR | Status: DC | PRN
  Administered 2012-03-21: 40 ug via INTRAVENOUS
  Administered 2012-03-21: 80 ug via INTRAVENOUS
  Administered 2012-03-21 (×2): 40 ug via INTRAVENOUS
  Administered 2012-03-21 (×3): 80 ug via INTRAVENOUS

## 2012-03-21 MED ORDER — MORPHINE SULFATE 2 MG/ML IJ SOLN: 1.0000 mg | INTRAMUSCULAR | Status: DC | PRN

## 2012-03-21 MED ORDER — MIDAZOLAM HCL 2 MG/2ML IJ SOLN
0.5000 mg | INTRAMUSCULAR | Status: DC | PRN
  Administered 2012-03-21: 1 mg via INTRAVENOUS

## 2012-03-21 MED ORDER — BUPIVACAINE-EPINEPHRINE PF 0.5-1:200000 % IJ SOLN
INTRAMUSCULAR | Status: DC | PRN
  Administered 2012-03-21: 30 mL

## 2012-03-21 MED ORDER — METOCLOPRAMIDE HCL 5 MG/ML IJ SOLN
5.0000 mg | Freq: Three times a day (TID) | INTRAMUSCULAR | Status: DC | PRN
  Filled 2012-03-21: qty 2

## 2012-03-21 MED ORDER — METHOCARBAMOL 500 MG PO TABS
500.0000 mg | ORAL_TABLET | Freq: Four times a day (QID) | ORAL | Status: DC | PRN
  Filled 2012-03-21: qty 1

## 2012-03-21 MED ORDER — PROPOFOL 10 MG/ML IV BOLUS
INTRAVENOUS | Status: DC | PRN
  Administered 2012-03-21: 110 mg via INTRAVENOUS

## 2012-03-21 MED ORDER — WARFARIN - PHARMACIST DOSING INPATIENT: Freq: Every day | Status: DC

## 2012-03-21 MED ORDER — DEXAMETHASONE SODIUM PHOSPHATE 4 MG/ML IJ SOLN
INTRAMUSCULAR | Status: DC | PRN
  Administered 2012-03-21: 4 mg via INTRAVENOUS

## 2012-03-21 MED ORDER — CIPROFLOXACIN IN D5W 400 MG/200ML IV SOLN
INTRAVENOUS | Status: DC | PRN
  Administered 2012-03-21: 400 mg via INTRAVENOUS

## 2012-03-21 MED ORDER — CIPROFLOXACIN HCL 500 MG PO TABS
500.0000 mg | ORAL_TABLET | Freq: Two times a day (BID) | ORAL | Status: AC
  Administered 2012-03-21 – 2012-03-22 (×2): 500 mg via ORAL
  Filled 2012-03-21 (×3): qty 1

## 2012-03-21 MED ORDER — LIDOCAINE HCL (CARDIAC) 20 MG/ML IV SOLN
INTRAVENOUS | Status: DC | PRN
  Administered 2012-03-21: 30 mg via INTRAVENOUS

## 2012-03-21 MED ORDER — FENOFIBRATE 54 MG PO TABS
54.0000 mg | ORAL_TABLET | Freq: Every day | ORAL | Status: DC
  Administered 2012-03-22 – 2012-03-23 (×2): 54 mg via ORAL
  Filled 2012-03-21 (×2): qty 1

## 2012-03-21 MED ORDER — FENTANYL CITRATE 0.05 MG/ML IJ SOLN
INTRAMUSCULAR | Status: AC
  Administered 2012-03-21: 100 ug via INTRAVENOUS
  Filled 2012-03-21: qty 2

## 2012-03-21 MED ORDER — ARTIFICIAL TEARS OP OINT
TOPICAL_OINTMENT | OPHTHALMIC | Status: DC | PRN
  Administered 2012-03-21: 1 via OPHTHALMIC

## 2012-03-21 MED ORDER — OXYCODONE HCL 5 MG PO TABS
5.0000 mg | ORAL_TABLET | ORAL | Status: DC | PRN
  Administered 2012-03-21: 5 mg via ORAL
  Administered 2012-03-22 (×2): 10 mg via ORAL
  Administered 2012-03-22: 5 mg via ORAL
  Administered 2012-03-23: 10 mg via ORAL
  Filled 2012-03-21: qty 2
  Filled 2012-03-21: qty 1
  Filled 2012-03-21 (×2): qty 2
  Filled 2012-03-21: qty 1

## 2012-03-21 MED ORDER — POVIDONE-IODINE 7.5 % EX SOLN
Freq: Once | CUTANEOUS | Status: DC
  Filled 2012-03-21: qty 118

## 2012-03-21 MED ORDER — MIDAZOLAM HCL 2 MG/2ML IJ SOLN
INTRAMUSCULAR | Status: AC
  Administered 2012-03-21: 1 mg via INTRAVENOUS
  Filled 2012-03-21: qty 2

## 2012-03-21 MED ORDER — ONDANSETRON HCL 4 MG/2ML IJ SOLN
4.0000 mg | Freq: Four times a day (QID) | INTRAMUSCULAR | Status: DC | PRN
  Administered 2012-03-22: 4 mg via INTRAVENOUS
  Filled 2012-03-21: qty 2

## 2012-03-21 MED ORDER — CIPROFLOXACIN IN D5W 400 MG/200ML IV SOLN
400.0000 mg | Freq: Once | INTRAVENOUS | Status: DC
  Filled 2012-03-21: qty 200

## 2012-03-21 MED ORDER — ONDANSETRON HCL 4 MG/2ML IJ SOLN
INTRAMUSCULAR | Status: DC | PRN
  Administered 2012-03-21: 4 mg via INTRAVENOUS

## 2012-03-21 MED ORDER — WARFARIN SODIUM 5 MG PO TABS
5.0000 mg | ORAL_TABLET | Freq: Once | ORAL | Status: AC
  Administered 2012-03-21: 5 mg via ORAL
  Filled 2012-03-21: qty 1

## 2012-03-21 MED ORDER — SIMVASTATIN 5 MG PO TABS
5.0000 mg | ORAL_TABLET | Freq: Every day | ORAL | Status: DC
  Administered 2012-03-21 – 2012-03-22 (×2): 5 mg via ORAL
  Filled 2012-03-21 (×3): qty 1

## 2012-03-21 MED ORDER — PATIENT'S GUIDE TO USING COUMADIN BOOK
Freq: Once | Status: AC
  Administered 2012-03-21: 17:00:00
  Filled 2012-03-21: qty 1

## 2012-03-21 MED ORDER — FENTANYL CITRATE 0.05 MG/ML IJ SOLN
INTRAMUSCULAR | Status: DC | PRN
  Administered 2012-03-21: 100 ug via INTRAVENOUS

## 2012-03-21 MED ORDER — POTASSIUM CHLORIDE IN NACL 20-0.9 MEQ/L-% IV SOLN
INTRAVENOUS | Status: AC
  Administered 2012-03-21: 17:00:00 via INTRAVENOUS
  Filled 2012-03-21 (×2): qty 1000

## 2012-03-21 MED ORDER — DONEPEZIL HCL 10 MG PO TABS
10.0000 mg | ORAL_TABLET | Freq: Every day | ORAL | Status: DC
  Administered 2012-03-22 – 2012-03-23 (×2): 10 mg via ORAL
  Filled 2012-03-21 (×2): qty 1

## 2012-03-21 MED ORDER — FENTANYL CITRATE 0.05 MG/ML IJ SOLN
50.0000 ug | INTRAMUSCULAR | Status: DC | PRN
  Administered 2012-03-21: 100 ug via INTRAVENOUS

## 2012-03-21 MED ORDER — OXYCODONE HCL 5 MG PO TABS: 5.0000 mg | ORAL_TABLET | Freq: Once | ORAL | Status: DC | PRN

## 2012-03-21 MED ORDER — CEFAZOLIN SODIUM 1-5 GM-% IV SOLN
1.0000 g | Freq: Three times a day (TID) | INTRAVENOUS | Status: AC
  Administered 2012-03-21 – 2012-03-22 (×3): 1 g via INTRAVENOUS
  Filled 2012-03-21 (×4): qty 50

## 2012-03-21 MED ORDER — ONDANSETRON HCL 4 MG PO TABS: 4.0000 mg | ORAL_TABLET | Freq: Four times a day (QID) | ORAL | Status: DC | PRN

## 2012-03-21 MED ORDER — METOCLOPRAMIDE HCL 5 MG PO TABS
5.0000 mg | ORAL_TABLET | Freq: Three times a day (TID) | ORAL | Status: DC | PRN
  Filled 2012-03-21: qty 2

## 2012-03-21 SURGICAL SUPPLY — 65 items
1.3 WIRE ×4 IMPLANT
BANDAGE ELASTIC 4 VELCRO ST LF (GAUZE/BANDAGES/DRESSINGS) ×2 IMPLANT
BANDAGE ELASTIC 6 VELCRO ST LF (GAUZE/BANDAGES/DRESSINGS) ×2 IMPLANT
BANDAGE GAUZE ELAST BULKY 4 IN (GAUZE/BANDAGES/DRESSINGS) ×2 IMPLANT
BIT DRILL 2.7 QC CANN 155 (BIT) ×2 IMPLANT
BIT DRILL 3.5 QC 155 (BIT) ×1 IMPLANT
BIT DRILL QC 2.7 6.3IN  SHORT (BIT) ×1
BIT DRILL QC 2.7 6.3IN SHORT (BIT) IMPLANT
BLADE SURG 10 STRL SS (BLADE) IMPLANT
BNDG CMPR 9X4 STRL LF SNTH (GAUZE/BANDAGES/DRESSINGS) ×1
BNDG COHESIVE 6X5 TAN STRL LF (GAUZE/BANDAGES/DRESSINGS) ×2 IMPLANT
BNDG ESMARK 4X9 LF (GAUZE/BANDAGES/DRESSINGS) ×2 IMPLANT
CLOTH BEACON ORANGE TIMEOUT ST (SAFETY) ×2 IMPLANT
COVER MAYO STAND STRL (DRAPES) ×2 IMPLANT
COVER SURGICAL LIGHT HANDLE (MISCELLANEOUS) ×2 IMPLANT
CUFF TOURNIQUET SINGLE 34IN LL (TOURNIQUET CUFF) IMPLANT
CUFF TOURNIQUET SINGLE 44IN (TOURNIQUET CUFF) IMPLANT
DRAPE C-ARM 42X72 X-RAY (DRAPES) ×2 IMPLANT
DRAPE INCISE IOBAN 66X45 STRL (DRAPES) ×2 IMPLANT
DRAPE SURG 17X23 STRL (DRAPES) ×2 IMPLANT
DRAPE U-SHAPE 47X51 STRL (DRAPES) ×2 IMPLANT
DRSG PAD ABDOMINAL 8X10 ST (GAUZE/BANDAGES/DRESSINGS) ×2 IMPLANT
DURAPREP 26ML APPLICATOR (WOUND CARE) ×1 IMPLANT
ELECT REM PT RETURN 9FT ADLT (ELECTROSURGICAL) ×2
ELECTRODE REM PT RTRN 9FT ADLT (ELECTROSURGICAL) ×1 IMPLANT
FACESHIELD LNG OPTICON STERILE (SAFETY) ×2 IMPLANT
GAUZE XEROFORM 5X9 LF (GAUZE/BANDAGES/DRESSINGS) ×2 IMPLANT
GLOVE BIOGEL PI IND STRL 8 (GLOVE) ×1 IMPLANT
GLOVE BIOGEL PI INDICATOR 8 (GLOVE) ×1
GLOVE SURG ORTHO 8.0 STRL STRW (GLOVE) ×2 IMPLANT
GOWN PREVENTION PLUS LG XLONG (DISPOSABLE) IMPLANT
GOWN STRL NON-REIN LRG LVL3 (GOWN DISPOSABLE) ×6 IMPLANT
HANDPIECE INTERPULSE COAX TIP (DISPOSABLE)
KIT BASIN OR (CUSTOM PROCEDURE TRAY) ×2 IMPLANT
KIT ROOM TURNOVER OR (KITS) ×2 IMPLANT
MANIFOLD NEPTUNE II (INSTRUMENTS) ×2 IMPLANT
NDL HYPO 25GX1X1/2 BEV (NEEDLE) ×1 IMPLANT
NEEDLE HYPO 25GX1X1/2 BEV (NEEDLE) ×2 IMPLANT
NS IRRIG 1000ML POUR BTL (IV SOLUTION) ×2 IMPLANT
PACK ORTHO EXTREMITY (CUSTOM PROCEDURE TRAY) ×2 IMPLANT
PAD ARMBOARD 7.5X6 YLW CONV (MISCELLANEOUS) ×4 IMPLANT
PAD CAST 4YDX4 CTTN HI CHSV (CAST SUPPLIES) ×1 IMPLANT
PADDING CAST COTTON 4X4 STRL (CAST SUPPLIES) ×2
PLATE FIBULA DISTAL 5 HOLE (Plate) ×2 IMPLANT
PUTTY BONE DBX 2.5 MIS (Bone Implant) ×1 IMPLANT
SCREW 5.0X10 (Screw) ×2 IMPLANT
SCREW CANNULATED 4.0X42 (Screw) ×2 IMPLANT
SCREW LOCK 10X3.5XST NS (Screw) IMPLANT
SCREW LOCK 3.5X10 (Screw) ×6 IMPLANT
SCREW LOCK 3.5X14 (Screw) ×1 IMPLANT
SCREW NON LOCK 3.5X12 (Screw) ×3 IMPLANT
SET HNDPC FAN SPRY TIP SCT (DISPOSABLE) IMPLANT
SPONGE GAUZE 4X4 12PLY (GAUZE/BANDAGES/DRESSINGS) ×2 IMPLANT
STOCKINETTE IMPERVIOUS 9X36 MD (GAUZE/BANDAGES/DRESSINGS) ×1 IMPLANT
SUCTION FRAZIER TIP 10 FR DISP (SUCTIONS) ×2 IMPLANT
SUT ETHILON 3 0 PS 1 (SUTURE) ×4 IMPLANT
SUT VIC AB 2-0 CTB1 (SUTURE) ×4 IMPLANT
SUT VIC AB 3-0 SH 27 (SUTURE) ×4
SUT VIC AB 3-0 SH 27X BRD (SUTURE) ×2 IMPLANT
SYR CONTROL 10ML LL (SYRINGE) ×2 IMPLANT
TOWEL OR 17X24 6PK STRL BLUE (TOWEL DISPOSABLE) ×2 IMPLANT
TOWEL OR 17X26 10 PK STRL BLUE (TOWEL DISPOSABLE) ×2 IMPLANT
TUBE CONNECTING 12X1/4 (SUCTIONS) ×2 IMPLANT
WATER STERILE IRR 1000ML POUR (IV SOLUTION) ×2 IMPLANT
YANKAUER SUCT BULB TIP NO VENT (SUCTIONS) ×1 IMPLANT

## 2012-03-21 NOTE — OR Nursing (Signed)
Tachy/brady arrythmia with frequent PAC's / Dr Sampson Goon (MDA) aware and requests telemetry bed

## 2012-03-21 NOTE — Brief Op Note (Signed)
03/21/2012  2:04 PM  PATIENT:  Kristine Cox  72 y.o. female  PRE-OPERATIVE DIAGNOSIS:  Left ankle fracture  POST-OPERATIVE DIAGNOSIS:  Left ankle fracture  PROCEDURE:  Procedure(s): OPEN REDUCTION INTERNAL FIXATION (ORIF) ANKLE FRACTURE, trimalleolar  SURGEON:  Surgeon(s): Cammy Copa, MD  ASSISTANT:   ANESTHESIA:   general  EBL: 50 ml    Total I/O In: 2000 [I.V.:2000] Out: 100 [Blood:100]  BLOOD ADMINISTERED: none  DRAINS: none   LOCAL MEDICATIONS USED:  none  SPECIMEN:  No Specimen  COUNTS:  YES  TOURNIQUET:  Ankle esmarch for 50 min  DICTATION: .Other Dictation: Dictation Number [id number]  PLAN OF CARE: Admit for overnight observation  PATIENT DISPOSITION:  PACU - hemodynamically stable

## 2012-03-21 NOTE — Anesthesia Preprocedure Evaluation (Signed)
Anesthesia Evaluation  Patient identified by MRN, date of birth, ID band Patient awake    Reviewed: Allergy & Precautions, H&P , NPO status , Patient's Chart, lab work & pertinent test results  Airway Mallampati: II TM Distance: >3 FB Neck ROM: Full    Dental No notable dental hx. (+) Edentulous Upper, Edentulous Lower and Dental Advisory Given   Pulmonary neg pulmonary ROS,  breath sounds clear to auscultation  Pulmonary exam normal       Cardiovascular hypertension, On Medications Rhythm:Regular Rate:Normal     Neuro/Psych PSYCHIATRIC DISORDERS negative neurological ROS     GI/Hepatic negative GI ROS, Neg liver ROS,   Endo/Other  negative endocrine ROS  Renal/GU negative Renal ROS  negative genitourinary   Musculoskeletal   Abdominal   Peds  Hematology negative hematology ROS (+)   Anesthesia Other Findings   Reproductive/Obstetrics negative OB ROS                           Anesthesia Physical Anesthesia Plan  ASA: II  Anesthesia Plan: General and Regional   Post-op Pain Management:    Induction: Intravenous  Airway Management Planned: Oral ETT  Additional Equipment:   Intra-op Plan:   Post-operative Plan: Extubation in OR  Informed Consent: I have reviewed the patients History and Physical, chart, labs and discussed the procedure including the risks, benefits and alternatives for the proposed anesthesia with the patient or authorized representative who has indicated his/her understanding and acceptance.   Dental advisory given  Plan Discussed with: CRNA  Anesthesia Plan Comments:         Anesthesia Quick Evaluation

## 2012-03-21 NOTE — Anesthesia Procedure Notes (Signed)
Anesthesia Regional Block:  Popliteal block  Pre-Anesthetic Checklist: ,, timeout performed, Correct Patient, Correct Site, Correct Laterality, Correct Procedure, Correct Position, site marked, Risks and benefits discussed, pre-op evaluation, post-op pain management  Laterality: Left  Prep: Maximum Sterile Barrier Precautions used and chloraprep       Needles:  Injection technique: Single-shot  Needle Type: Echogenic Stimulator Needle     Needle Length:cm 9 cm Needle Gauge: 21 G    Additional Needles:  Procedures: ultrasound guided (picture in chart) and nerve stimulator Popliteal block  Nerve Stimulator or Paresthesia:  Response: Peroneal, 0.4 mA,  Response: Tibial, 0.4 mA,   Additional Responses:   Narrative:  Start time: 03/21/2012 10:01 AM End time: 03/21/2012 10:15 AM Injection made incrementally with aspirations every 5 mL. Anesthesiologist: Sampson Goon, MD  Additional Notes: 2% Lidocaine skin wheel. Saphenous block with 10cc of 0.5% Bupivicaine plain.  Popliteal block

## 2012-03-21 NOTE — OR Nursing (Signed)
Dr dean notified of arrythmias / will admit to telem and have cards see

## 2012-03-21 NOTE — Transfer of Care (Signed)
Immediate Anesthesia Transfer of Care Note  Patient: Kristine Cox  Procedure(s) Performed: Procedure(s) (LRB) with comments: OPEN REDUCTION INTERNAL FIXATION (ORIF) ANKLE FRACTURE (Left) - Open Reduction Internal Fixation left ankle fracture  Patient Location: PACU  Anesthesia Type:GA combined with regional for post-op pain  Level of Consciousness: awake, alert  and oriented  Airway & Oxygen Therapy: Patient Spontanous Breathing and Patient connected to nasal cannula oxygen  Post-op Assessment: Report given to PACU RN, Post -op Vital signs reviewed and stable and Patient moving all extremities X 4  Post vital signs: Reviewed and stable  Complications: No apparent anesthesia complications

## 2012-03-21 NOTE — H&P (Signed)
Kristine Cox is an 72 y.o. female.   Chief Complaint: Left ankle pain HPI: Kristine Cox is a 72 year old female who underwent an injury 02/25/2012 which was a trimalleolar ankle fracture. She had been treated by Dr. handy but because of insurance issues at the beginning of the year had to change physicians. She's been nonweightbearing in a splint. She describes a continued pain in the ankle. She's currently 3 weeks out from her injury. She was seen in the clinic where posterior lateral fracture blister has been managed. Patient also had urinary tract infection noted on preoperative evaluation which is been treated with 2 doses of Cipro. Patient is a mature he lives with her sister.  Past Medical History  Diagnosis Date  . Hypertension   . Depression   . Hyperlipidemia     Past Surgical History  Procedure Date  . Tonsillectomy   . Abdominal hysterectomy   . Cholecystectomy     No family history on file. Social History:  reports that she has quit smoking. Her smoking use included Cigarettes. She has never used smokeless tobacco. She reports that she does not drink alcohol or use illicit drugs.  Allergies:  Allergies  Allergen Reactions  . Succinylcholine     No prescriptions prior to admission    Results for orders placed during the hospital encounter of 03/20/12 (from the past 48 hour(s))  BASIC METABOLIC PANEL     Status: Abnormal   Collection Time   03/20/12  1:55 PM      Component Value Range Comment   Sodium 139  135 - 145 mEq/L    Potassium 4.3  3.5 - 5.1 mEq/L HEMOLYSIS AT THIS LEVEL MAY AFFECT RESULT   Chloride 101  96 - 112 mEq/L    CO2 27  19 - 32 mEq/L    Glucose, Bld 84  70 - 99 mg/dL    BUN 9  6 - 23 mg/dL    Creatinine, Ser 1.61  0.50 - 1.10 mg/dL    Calcium 9.7  8.4 - 09.6 mg/dL    GFR calc non Af Amer 87 (*) >90 mL/min    GFR calc Af Amer >90  >90 mL/min   CBC     Status: Normal   Collection Time   03/20/12  1:55 PM      Component Value Range Comment   WBC  9.3  4.0 - 10.5 K/uL    RBC 4.76  3.87 - 5.11 MIL/uL    Hemoglobin 13.6  12.0 - 15.0 g/dL    HCT 04.5  40.9 - 81.1 %    MCV 86.1  78.0 - 100.0 fL    MCH 28.6  26.0 - 34.0 pg    MCHC 33.2  30.0 - 36.0 g/dL    RDW 91.4  78.2 - 95.6 %    Platelets 351  150 - 400 K/uL   SURGICAL PCR SCREEN     Status: Normal   Collection Time   03/20/12  1:59 PM      Component Value Range Comment   MRSA, PCR NEGATIVE  NEGATIVE    Staphylococcus aureus NEGATIVE  NEGATIVE   URINALYSIS, ROUTINE W REFLEX MICROSCOPIC     Status: Abnormal   Collection Time   03/20/12  2:00 PM      Component Value Range Comment   Color, Urine YELLOW  YELLOW    APPearance HAZY (*) CLEAR    Specific Gravity, Urine 1.005  1.005 - 1.030    pH 6.5  5.0 - 8.0    Glucose, UA NEGATIVE  NEGATIVE mg/dL    Hgb urine dipstick SMALL (*) NEGATIVE    Bilirubin Urine NEGATIVE  NEGATIVE    Ketones, ur NEGATIVE  NEGATIVE mg/dL    Protein, ur NEGATIVE  NEGATIVE mg/dL    Urobilinogen, UA 0.2  0.0 - 1.0 mg/dL    Nitrite NEGATIVE  NEGATIVE    Leukocytes, UA LARGE (*) NEGATIVE   URINE MICROSCOPIC-ADD ON     Status: Abnormal   Collection Time   03/20/12  2:00 PM      Component Value Range Comment   Squamous Epithelial / LPF FEW (*) RARE    WBC, UA 11-20  <3 WBC/hpf    RBC / HPF 0-2  <3 RBC/hpf    Bacteria, UA FEW (*) RARE    Dg Chest 2 View  03/20/2012  *RADIOLOGY REPORT*  Clinical Data: Preop  CHEST - 2 VIEW  Comparison: 03/21/2005  Findings: Cardiomediastinal silhouette is stable.  No acute infiltrate or pulmonary edema.  Bony thorax is stable.  IMPRESSION: No active disease.  No significant change.   Original Report Authenticated By: Natasha Mead, M.D.     Review of Systems  Constitutional: Negative.   HENT: Negative.   Eyes: Negative.   Respiratory: Negative.   Cardiovascular: Negative.   Gastrointestinal: Negative.   Genitourinary: Negative.   Musculoskeletal: Positive for joint pain.  Skin: Negative.   Neurological: Negative.     Psychiatric/Behavioral: Negative.     There were no vitals taken for this visit. Physical Exam  Constitutional: She appears well-developed.  HENT:  Head: Normocephalic.  Eyes: Pupils are equal, round, and reactive to light.  Neck: Normal range of motion.  Cardiovascular: Normal rate.   Respiratory: Effort normal.  GI: Soft.   examination of left ankle demonstrates side to side Taylor or instability increased left versus right. Shows have medial sided swelling and palpable fracture gap. Laterally there is a quarter-sized fracture blister about 3-4 cm away from a potentially planned incision for lateral malleolus. This fracture blister is currently dry. Pedal pulses are palpable. Compartments are soft.  Assessment/Plan Impression is trimalleolar ankle fracture left hand side with healing fracture blister away from the site of planned incision. She's currently 3 weeks out from this injury. She does have evidence of talar instability on CT scan and on physical exam. Plan is for a correction internal fixation of the lateral and medial malleolus. Risk and benefits discussed with patient we will and to infection ankle stiffness incomplete pain relief. Patient extends the risk and benefits and wished to proceed. All questions answered  Kristine Cox 03/21/2012, 7:36 AM

## 2012-03-21 NOTE — Anesthesia Postprocedure Evaluation (Signed)
  Anesthesia Post-op Note  Patient: Kristine Cox  Procedure(s) Performed: Procedure(s) (LRB) with comments: OPEN REDUCTION INTERNAL FIXATION (ORIF) ANKLE FRACTURE (Left) - Open Reduction Internal Fixation left ankle fracture  Patient Location: PACU  Anesthesia Type:GA combined with regional for post-op pain  Level of Consciousness: awake  Airway and Oxygen Therapy: Patient Spontanous Breathing and Patient connected to nasal cannula oxygen  Post-op Pain: none  Post-op Assessment: Post-op Vital signs reviewed, Patient's Cardiovascular Status Stable, Respiratory Function Stable, Patent Airway and No signs of Nausea or vomiting  Post-op Vital Signs: Reviewed and stable  Complications: No apparent anesthesia complications

## 2012-03-21 NOTE — Progress Notes (Signed)
ANTICOAGULATION CONSULT NOTE - Initial Consult  Pharmacy Consult for Coumadin Indication: VTE prophylaxis s/p L ankle ORIF  Allergies  Allergen Reactions  . Succinylcholine     Patient Measurements: Height: 5\' 3"  (160 cm) Weight: 134 lb 9.6 oz (61.054 kg) (Pt unable to stand and bed weight is malfunctioning) IBW/kg (Calculated) : 52.4   Vital Signs: Temp: 97.6 F (36.4 C) (01/14 1617) Temp src: Oral (01/14 1617) BP: 118/73 mmHg (01/14 1617) Pulse Rate: 65  (01/14 1617)  Labs:  Basename 03/21/12 1651 03/20/12 1355  HGB -- 13.6  HCT -- 41.0  PLT -- 351  APTT -- --  LABPROT 13.6 --  INR 1.05 --  HEPARINUNFRC -- --  CREATININE -- 0.65  CKTOTAL -- --  CKMB -- --  TROPONINI -- --    Estimated Creatinine Clearance: 53.4 ml/min (by C-G formula based on Cr of 0.65).   Medical History: Past Medical History  Diagnosis Date  . Hypertension   . Depression   . Hyperlipidemia     Medications:  Prescriptions prior to admission  Medication Sig Dispense Refill  . aspirin EC 81 MG tablet Take 81 mg by mouth daily.      Marland Kitchen donepezil (ARICEPT) 10 MG tablet Take 10 mg by mouth daily.      . fenofibrate (TRICOR) 145 MG tablet Take 145 mg by mouth daily.      Marland Kitchen FLUoxetine (PROZAC) 20 MG capsule Take 20 mg by mouth daily.      Marland Kitchen oxyCODONE-acetaminophen (PERCOCET/ROXICET) 5-325 MG per tablet Take 2 tablets by mouth at bedtime.      . pravastatin (PRAVACHOL) 40 MG tablet Take 40 mg by mouth daily.        Assessment: 72 y.o. female presents with L ankle pain. S/p L ankle ORIF 1/14. To begin coumadin post-op for VTE prophylaxis. Baseline INR 1.05.  Goal of Therapy:  INR 2-3 Monitor platelets by anticoagulation protocol: Yes   Plan:  1. Daily INR 2. Coumadin 5mg  po today 3. Coumadin booklet and video  Christoper Fabian, PharmD, BCPS Clinical pharmacist, pager [phone] 03/21/2012,5:32 PM

## 2012-03-22 ENCOUNTER — Encounter (HOSPITAL_COMMUNITY): Payer: Self-pay | Admitting: Orthopedic Surgery

## 2012-03-22 LAB — URINE CULTURE

## 2012-03-22 LAB — PROTIME-INR: INR: 1.13 (ref 0.00–1.49)

## 2012-03-22 MED ORDER — WARFARIN SODIUM 5 MG PO TABS
5.0000 mg | ORAL_TABLET | Freq: Once | ORAL | Status: AC
  Administered 2012-03-22: 5 mg via ORAL
  Filled 2012-03-22: qty 1

## 2012-03-22 NOTE — Progress Notes (Signed)
Around 11 pm (1/14) Pt stated she had numbness in her left leg. Pt stated she could not feel leg or move her toes. Pt stated it was the same feeling she had since surgery. Pt toes were warm to touch. Pt had toes and good cap refill.  Md on called made aware and stated to elevate the effected leg and to reassure the pt that it was normal. Will cont monitor pt.

## 2012-03-22 NOTE — Op Note (Signed)
NAMEROSALIE, Cox NO.:  [id number]  MEDICAL RECORD NO.:  [id number]  LOCATION:  [id number]                        FACILITY:  [facility]  PHYSICIAN:  Burnard Bunting, M.D.    DATE OF BIRTH:  1940-10-18  DATE OF PROCEDURE: DATE OF DISCHARGE:                              OPERATIVE REPORT   PREOPERATIVE DIAGNOSIS:  Trimalleolar ankle fracture, left 18 weeks old.  POSTOPERATIVE DIAGNOSIS:  Trimalleolar ankle fracture, left 36 weeks old.  PROCEDURE:  Open reduction and internal fixation trimalleolar ankle fracture with fixation of medial and lateral malleolus.  SURGEON:  Burnard Bunting, M.D.  ASSISTANT:  None.  ANESTHESIA:  General endotracheal.  ESTIMATED BLOOD LOSS:  50 mL.  DRAINS:  None.  Popliteal block was utilized as well.  PROCEDURE:  The patient was brought to the operating room, where general endotracheal anesthesia was induced.  Preoperative antibiotics were administered.  Time-out was called.  Left leg was prescrubbed with alcohol and Betadine, which was allowed to air dry, prepped with DuraPrep solution, draped in sterile manner.  Incision was made over the lateral malleolus.  Skin and subcutaneous tissue was sharply divided. Partial fracture healing had occurred.  Gapping was present.  Tissue was removed from the gaps.  Attention was then directed towards the medial malleolus.  In a similar fashion, skin and subcutaneous tissue was sharply divided.  Saphenous vein and nerve protected on the lateral side.  Superficial peroneal nerve also identified and protected.  The fracture site was identified.  Gapping and malreduction was present, these were both corrected.  The lateral malleolus was then fixed with a Smith and Nephew 5-hole plate.  The fibula was now brought out to full length, but cortical contact was achieved, which was deemed more important for healing.  Following application of the plate, medial malleolus was reduced and fixed with two 4-0  cannulated cancellous screws.  Good reduction was confirmed in the AP and lateral mortise planes under fluoroscopy.  Ankle Esmarch was released.  Both incisions were thoroughly irrigated.  Medial incision was closed using interrupted inverted 3-0 Vicryl and 3-0 nylon.  Lateral incision was irrigated and the remaining gap in the fibula was filled with bone graft, and then closed using interrupted inverted 3-0 Vicryl and 3-0 nylon.  Well-padded posterior splint was applied.  The patient tolerated the procedure well without immediate complications, transferred to the recovery room in stable condition.     Burnard Bunting, M.D.     GSD/MEDQ  D:  03/21/2012  T:  03/22/2012  Job:  [id number]

## 2012-03-22 NOTE — Care Management Note (Unsigned)
    Page 1 of 1   03/22/2012     11:25:29 AM   CARE MANAGEMENT NOTE 03/22/2012  Patient:  Kristine Cox, Kristine Cox   Account Number:  [id number]  Date Initiated:  03/22/2012  Documentation initiated by:  GRAVES-BIGELOW,Rishith Siddoway  Subjective/Objective Assessment:   Pt admiited with Left Ankle Pain. Previous ankle fx from fall 02-25-12. Pt lives alone in an apratment and will return home with sister in [place].     Action/Plan:   Plan for d/c in am. CM did provide pt with Agency List. Pt has to make a choice. 3W CM will call 5N CM to assist with referral .   Anticipated DC Date:  03/29/2012   Anticipated DC Plan:  HOME W HOME HEALTH SERVICES      DC Planning Services  CM consult      [facility] Choice  HOME HEALTH  DURABLE MEDICAL EQUIPMENT   Choice offered to / List presented to:  C-1 Patient           Status of service:  Completed, signed off Medicare Important Message given?   (If response is "NO", the following Medicare IM given date fields will be blank) Date Medicare IM given:   Date Additional Medicare IM given:    Discharge Disposition:    Per UR Regulation:  Reviewed for med. necessity/level of care/duration of stay  If discussed at Long Length of Stay Meetings, dates discussed:    Comments:

## 2012-03-22 NOTE — Evaluation (Signed)
Physical Therapy Evaluation Patient Details Name: Kristine Cox MRN: [id number] DOB: 1940-05-30 Today's Date: 03/22/2012 Time: 4098-1191 PT Time Calculation (min): 22 min  PT Assessment / Plan / Recommendation Clinical Impression  Pt. is a 72 y/o female s/p left trimalleolar ankle fracture s/p ORIF who presents with decreased safety, decreased activity tolerance, decreased balance all limiting independence with mobility.  She will benefit from skilled PT in the acute setting to maximize independence with mobility to allow d/c home with sister's assist and HHPT.    PT Assessment  Patient needs continued PT services    Follow Up Recommendations  Home health PT                Equipment Recommendations  None recommended by PT         Frequency Min 5X/week    Precautions / Restrictions Precautions Precautions: Fall Restrictions Weight Bearing Restrictions: Yes LLE Weight Bearing: Non weight bearing   Pertinent Vitals/Pain Min c/o of pain left foot with dependency      Mobility  Bed Mobility Details for Bed Mobility Assistance: patient up in chair Transfers Sit to Stand: With upper extremity assist;With armrests;From chair/3-in-1;4: Min guard Stand to Sit: To chair/3-in-1;4: Min guard;Without upper extremity assist Details for Transfer Assistance: sits in chair without reaching back, still holding to walker and sits uncontrolled. Ambulation/Gait Ambulation/Gait Assistance: 5: Supervision;4: Min guard Ambulation Distance (Feet): 40 Feet Assistive device: Rolling walker Ambulation/Gait Assistance Details: keeps walker close enough, keeps left foot in front; cues for safety turning due to IV line Stairs: No (demonstrated reverse technique and gave handout)              PT Diagnosis: Difficulty walking;Generalized weakness  PT Problem List: Decreased strength;Decreased activity tolerance;Decreased balance;Decreased mobility;Decreased safety awareness PT Treatment  Interventions: Gait training;DME instruction;Stair training;Functional mobility training;Therapeutic activities;Therapeutic exercise;Balance training;Patient/family education   PT Goals Acute Rehab PT Goals PT Goal Formulation: With patient Time For Goal Achievement: 03/27/12 Potential to Achieve Goals: Good Pt will go Supine/Side to Sit: with modified independence PT Goal: Supine/Side to Sit - Progress: Goal set today Pt will go Sit to Supine/Side: with modified independence PT Goal: Sit to Supine/Side - Progress: Goal set today Pt will go Sit to Stand: with modified independence;with upper extremity assist PT Goal: Sit to Stand - Progress: Goal set today Pt will go Stand to Sit: with modified independence;with upper extremity assist PT Goal: Stand to Sit - Progress: Goal set today Pt will Ambulate: 51 - 150 feet;with rolling walker;with modified independence PT Goal: Ambulate - Progress: Goal set today Pt will Go Up / Down Stairs: 1-2 stairs;with rolling walker;with min assist PT Goal: Up/Down Stairs - Progress: Goal set today  Visit Information  Last PT Received On: 03/22/12    Subjective Data  Subjective: I've been getting around better with the walker than the wheelchair Patient Stated Goal: To return to sister's home   Prior Functioning  Home Living Lives With: Family Available Help at Discharge: Family Type of Home: House Home Access: Stairs to enter Secretary/administrator of Steps: 2 (at sister's home, her home with more steps to second floor apt) Entrance Stairs-Rails: Left;Right Home Layout: One level Home Adaptive Equipment: Walker - rolling;Wheelchair - Architectural technologist without back Prior Function Level of Independence: Independent Able to Take Stairs?: Yes Driving: Yes Vocation: Retired Musician: No difficulties    Cognition  Overall Cognitive Status: History of cognitive impairments - at baseline Arousal/Alertness:  Awake/alert Cognition - Other Comments: H/o  mild short term memory deficits    Extremity/Trunk Assessment Right Lower Extremity Assessment RLE ROM/Strength/Tone: [facility] for tasks assessed Left Lower Extremity Assessment LLE ROM/Strength/Tone: Unable to fully assess;Deficits LLE ROM/Strength/Tone Deficits: splint on left foot; able to wiggle toes; lifts leg without help       End of Session PT - End of Session Equipment Utilized During Treatment: Gait belt Activity Tolerance: Patient tolerated treatment well Patient left: in chair;with call bell/phone within reach  GP     [employer] 03/22/2012, 12:25 PM  [place], PT [phone] 03/22/2012

## 2012-03-22 NOTE — Progress Notes (Signed)
We received a call a cardiology consult concerning this patient.  There is no note in the chart concerning the need for a cardiology consult.  The patient herself denies any cardiac symptoms or complaints.  I questioned her nurse who was not aware of any need for cardiology consultation. Review of her telemetry shows normal sinus rhythm.  She had an appropriate sinus tachycardia during physical therapy ambulation.  She appears to be doing well.  Please reconsult if any particular problems or questions needs to be addressed

## 2012-03-22 NOTE — Progress Notes (Signed)
ANTICOAGULATION CONSULT NOTE - Follow Up Consult  Pharmacy Consult for Coumadin Indication: VTE prophylaxis  Allergies  Allergen Reactions  . Succinylcholine     Patient Measurements: Height: 5\' 3"  (160 cm) Weight: 134 lb 9.6 oz (61.054 kg) (Pt unable to stand and bed weight is malfunctioning) IBW/kg (Calculated) : 52.4  Heparin Dosing Weight:   Vital Signs: Temp: 97.9 F (36.6 C) (01/15 0559) Temp src: Oral (01/15 0559) BP: 99/61 mmHg (01/15 0559) Pulse Rate: 63  (01/15 0559)  Labs:  Basename 03/22/12 0800 03/21/12 1651 03/20/12 1355  HGB -- -- 13.6  HCT -- -- 41.0  PLT -- -- 351  APTT -- -- --  LABPROT 14.3 13.6 --  INR 1.13 1.05 --  HEPARINUNFRC -- -- --  CREATININE -- -- 0.65  CKTOTAL -- -- --  CKMB -- -- --  TROPONINI -- -- --    Estimated Creatinine Clearance: 53.4 ml/min (by C-G formula based on Cr of 0.65).  Assessment: 71yof on Coumadin for VTE prophylaxis s/p ankle ORIF. INR (1.13) is subtherapeutic as expected - will repeat Coumadin 5mg  tonight. - No CBC this AM - No significant bleeding reported  Goal of Therapy:  INR 2-3   Plan:  1. Repeat Coumadin 5mg  po x 1 today 2. Follow-up AM INR  Cleon Dew [phone] 03/22/2012,9:03 AM

## 2012-03-22 NOTE — Progress Notes (Signed)
Pt stable - block still in effect vss No df toes yet pf ok - dorsal foot numb -  Foot perfused left Plan observe today until block wears off - mobilize nwb LLE today -  Dc am

## 2012-03-22 NOTE — Progress Notes (Signed)
Pt called nursing into room d/t c/o toes being hot.  Warm to touch, <3sec cap refill, no change in edema from morning assessment, no discoloration.  Oral temp 97.6.  Ice applied along with elevation.

## 2012-03-22 NOTE — Evaluation (Signed)
Occupational Therapy Evaluation Patient Details Name: Kristine Cox MRN: [id number] DOB: 1940/04/10 Today's Date: 03/22/2012 Time: 1191-4782 OT Time Calculation (min): 35 min  OT Assessment / Plan / Recommendation Clinical Impression  Pt admitted for a redo ORIF of L ankle due to nonhealing blister who is very familiar with being NWB on LLE and lives with her sister.  Pt would benefit from cont OT acutely to refresh safety precautions while doing adls with walker so she can safely d/c home with her sister.    OT Assessment  Patient needs continued OT Services    Follow Up Recommendations  No OT follow up    Barriers to Discharge None Sister is available 24/7 and has taken care of sister since mid December.  Equipment Recommendations  Tub/shower bench    Recommendations for Other Services    Frequency  Min 2X/week    Precautions / Restrictions Precautions Precautions: Fall Restrictions Weight Bearing Restrictions: Yes LLE Weight Bearing: Non weight bearing   Pertinent Vitals/Pain Pt with no reports of pain.    ADL  Eating/Feeding: Performed;Independent Where Assessed - Eating/Feeding: Chair Grooming: Performed;Wash/dry hands;Min guard Where Assessed - Grooming: Unsupported standing Upper Body Bathing: Simulated;Set up Where Assessed - Upper Body Bathing: Unsupported sitting Lower Body Bathing: Performed;Min guard Where Assessed - Lower Body Bathing: Supported sit to stand Upper Body Dressing: Simulated;Set up Where Assessed - Upper Body Dressing: Unsupported sitting Lower Body Dressing: Performed;Min guard Where Assessed - Lower Body Dressing: Supported sit to stand Toilet Transfer: Performed;Min guard Statistician Method: Sit to stand;Stand pivot Acupuncturist: Comfort height toilet;Grab bars Toileting - Architect and Hygiene: Performed;Supervision/safety Where Assessed - Engineer, mining and Hygiene: Sit to stand from  3-in-1 or toilet Equipment Used: Rolling walker;Gait belt Transfers/Ambulation Related to ADLs: Pt does very well walking w NWB LLE and walker.  Pt has been using walker like this since fracture in mid Dec.  Pt was min guard for mobility.  Needs cues for safety. ADL Comments: Pt overall min guard to S with all adls.  S to min guard only needed when standing for safety reasons.    OT Diagnosis: Generalized weakness  OT Problem List: Impaired balance (sitting and/or standing);Decreased cognition;Decreased safety awareness;Decreased knowledge of use of DME or AE OT Treatment Interventions:     OT Goals Acute Rehab OT Goals OT Goal Formulation: With patient/family Time For Goal Achievement: 03/29/12 Potential to Achieve Goals: Good ADL Goals Pt Will Perform Tub/Shower Transfer: with supervision;Transfer tub bench;Maintaining weight bearing status ADL Goal: Tub/Shower Transfer - Progress: Goal set today Additional ADL Goal #1: Pt will complete all aspects of toileting on regular height commode with walker and S. ADL Goal: Additional Goal #1 - Progress: Goal set today Miscellaneous OT Goals Miscellaneous OT Goal #1: Pt will transfer sit to stand and stand to sit using correct hand placement on walker to ensure safe transfers during adls. OT Goal: Miscellaneous Goal #1 - Progress: Goal set today  Visit Information  Last OT Received On: 03/22/12 Assistance Needed: +1    Subjective Data  Subjective: "I like to dance.  No dancing for me right now." Patient Stated Goal: to be I and dance again.   Prior Functioning     Home Living Lives With: Family;Other (Comment) (lives with sister) Available Help at Discharge: Family;Available 24 hours/day Type of Home: House Home Access: Stairs to enter [employer] of Steps: 1 Entrance Stairs-Rails: None Home Layout: One level Bathroom Shower/Tub: Forensic scientist:  Standard Bathroom Accessibility: Yes How  Accessible: Accessible via walker Home Adaptive Equipment: Walker - rolling;Shower chair without back;Wheelchair - manual Additional Comments: Pt uses w/c in large part of house and walker in bedroom area/bathroom. Prior Function Level of Independence: Independent Able to Take Stairs?: Yes Driving: Yes Vocation: Retired Comments: Worked for [employer].  Lived alone in a second story apartment prior to breaking ankle in Dec.  18 steps to her apt. so now living with sister.   Communication Communication: No difficulties Dominant Hand: Right         Vision/Perception Vision - Assessment Eye Alignment: Within Functional Limits Vision Assessment: Vision not tested   Cognition  Overall Cognitive Status: History of cognitive impairments - at baseline Arousal/Alertness: Awake/alert Orientation Level: Disoriented to;Time Behavior During Session: WFL for tasks performed Cognition - Other Comments: Spoke with sister about pt's mild STM deficits.  This is baseline for this pt.    Extremity/Trunk Assessment Right Upper Extremity Assessment RUE ROM/Strength/Tone: Within functional levels RUE Sensation: WFL - Light Touch RUE Coordination: WFL - gross/fine motor Left Upper Extremity Assessment LUE ROM/Strength/Tone: Within functional levels LUE Sensation: WFL - Light Touch LUE Coordination: WFL - gross/fine motor Trunk Assessment Trunk Assessment: Normal     Mobility Bed Mobility Bed Mobility: Supine to Sit;Sitting - Scoot to Edge of Bed Supine to Sit: 5: Supervision Sitting - Scoot to Edge of Bed: 5: Supervision Details for Bed Mobility Assistance: Pt safe and I with bed mobilty. Transfers Transfers: Sit to Stand;Stand to Sit Sit to Stand: 4: Min guard;With armrests;From bed Stand to Sit: 4: Min guard;With armrests;To chair/3-in-1 Details for Transfer Assistance: Pt very unsafe when using walker.  Pt required cues to push up from surface she is leaving and reach back for  surface she was going to.  Pt mildly impulsive.     Shoulder Instructions     Exercise     Balance Balance Balance Assessed: Yes Static Standing Balance Static Standing - Balance Support: Bilateral upper extremity supported;During functional activity Static Standing - Level of Assistance: 5: Stand by assistance Static Standing - Comment/# of Minutes: 5   End of Session OT - End of Session Equipment Utilized During Treatment: Gait belt Activity Tolerance: Patient tolerated treatment well Patient left: in chair;with call bell/phone within reach Nurse Communication: Mobility status  GO Functional Assessment Tool Used: clinical judgement Functional Limitation: Self care Self Care Current Status (Z6109): At least 20 percent but less than 40 percent impaired, limited or restricted Self Care Goal Status (U0454): At least 1 percent but less than 20 percent impaired, limited or restricted   Hope Budds 03/22/2012, 9:31 AM [phone]

## 2012-03-22 NOTE — Progress Notes (Signed)
UR Completed Stephanine Reas Graves-Bigelow, RN,BSN [phone]  

## 2012-03-23 ENCOUNTER — Encounter (HOSPITAL_COMMUNITY): Payer: Self-pay | Admitting: Orthopedic Surgery

## 2012-03-23 LAB — PROTIME-INR
INR: 2.06 — ABNORMAL HIGH (ref 0.00–1.49)
Prothrombin Time: 22.4 seconds — ABNORMAL HIGH (ref 11.6–15.2)

## 2012-03-23 MED ORDER — METHOCARBAMOL 500 MG PO TABS: 500.0000 mg | ORAL_TABLET | Freq: Four times a day (QID) | ORAL | Status: DC | PRN

## 2012-03-23 MED ORDER — WARFARIN SODIUM 5 MG PO TABS: 5.0000 mg | ORAL_TABLET | Freq: Every day | ORAL | Status: DC

## 2012-03-23 MED ORDER — OXYCODONE HCL 5 MG PO TABS: 5.0000 mg | ORAL_TABLET | ORAL | Status: DC | PRN

## 2012-03-23 NOTE — Progress Notes (Signed)
Pt stable vss Df pf ok Plan dc today

## 2012-03-23 NOTE — Progress Notes (Signed)
  CARE MANAGEMENT NOTE 03/23/2012  Patient:  MAKENNAH, OMURA   Account Number:  [id number]  Date Initiated:  03/22/2012  Documentation initiated by:  GRAVES-BIGELOW,BRENDA  Subjective/Objective Assessment:   Pt admiited with Left Ankle Pain. Previous ankle fx from fall 02-25-12. Pt lives alone in an apratment and will return home with sister in [place].     Action/Plan:   Plan for d/c in am. CM did provide pt with Agency List. Pt has to make a choice. 3W CM will call 5N CM to assist with referral .   Anticipated DC Date:  03/23/2012   Anticipated DC Plan:  HOME W HOME HEALTH SERVICES      DC Planning Services  CM consult      [facility] Choice  HOME HEALTH  DURABLE MEDICAL EQUIPMENT     Choice offered to / List presented to:  C-1 Patient        HH arranged  HH-2 PT  H agency  [facility]   Status of service:  Completed, signed off Medicare Important Message given?   (If response is "NO", the following Medicare IM given date fields will be blank) Date Medicare IM given:   Date Additional Medicare IM given:    Discharge Disposition:  HOME W HOME HEALTH SERVICES  Per UR Regulation:  Reviewed for med. necessity/level of care/duration of stay  If discussed at Long Length of Stay Meetings, dates discussed:    Comments:

## 2012-03-23 NOTE — Progress Notes (Signed)
Occupational Therapy Treatment Patient Details Name: Kristine Cox MRN: [id number] DOB: 09-10-1940 Today's Date: 03/23/2012 Time: 0272-5366 OT Time Calculation (min): 21 min  OT Assessment / Plan / Recommendation Comments on Treatment Session pt making progess and should continue with OT services to maximize level of function and safety    Follow Up Recommendations  No OT follow up    Barriers to Discharge       Equipment Recommendations  Tub/shower bench    Recommendations for Other Services    Frequency Min 2X/week   Plan Discharge plan remains appropriate    Precautions / Restrictions Precautions Precautions: Fall Restrictions Weight Bearing Restrictions: Yes LLE Weight Bearing: Non weight bearing       ADL  Lower Body Dressing: Performed;Min guard Where Assessed - Lower Body Dressing: Supported sit to stand;Unsupported sitting Tub/Shower Transfer: Performed;Supervision/safety;Min guard Tub/Shower Transfer Method: Stand pivot Psychologist, educational: Counsellor Used: Rolling walker;Sock aid;Gait belt;Long-handled shoe horn;Long-handled sponge;Reacher;Other (comment) (tub bench) ADL Comments: pt and her sister provided with educatin and demo of ADL A/E and tub bench for use at home    OT Diagnosis:    OT Problem List:   OT Treatment Interventions:     OT Goals ADL Goals ADL Goal: Tub/Shower Transfer - Progress: Progressing toward goals Miscellaneous OT Goals OT Goal: Miscellaneous Goal #1 - Progress: Progressing toward goals  Visit Information  Last OT Received On: 03/23/12     Subjective Data  Subjective: " My sister will keep helping me at home " Patient Stated Goal: To return home   Prior Functioning       Cognition  Overall Cognitive Status: Appears within functional limits for tasks assessed/performed Arousal/Alertness: Awake/alert Orientation Level: Appears intact for tasks assessed Behavior During Session: [facility] for tasks  performed    Mobility  Shoulder Instructions Bed Mobility Bed Mobility: Not assessed Transfers Transfers: Sit to Stand;Stand to Sit Sit to Stand: 6: Modified independent (Device/Increase time) Stand to Sit: 6: Modified independent (Device/Increase time)       Exercises      Balance Balance Balance Assessed: No   End of Session OT - End of Session Equipment Utilized During Treatment: Gait belt;Other (comment) (RW, ADL A/E , tub bench) Activity Tolerance: Patient tolerated treatment well Patient left: in chair;with call bell/phone within reach;with family/visitor present  GO Functional Limitation: Self care Self Care Discharge Status [phone]): At least 20 percent but less than 40 percent impaired, limited or restricted   Galen Manila 03/23/2012, 10:59 AM

## 2012-03-23 NOTE — Progress Notes (Signed)
Physical Therapy Treatment Patient Details Name: Kristine Cox MRN: [id number] DOB: [birth year] Today's Date: 03/23/2012 Time: 8295-6213 PT Time Calculation (min): 20 min  PT Assessment / Plan / Recommendation Comments on Treatment Session  Patient progressing well. Anticipate DC today. Sister was present throughout and is very helpful and will be with her at discharge    Follow Up Recommendations  Home health PT     Does the patient have the potential to tolerate intense rehabilitation     Barriers to Discharge        Equipment Recommendations  None recommended by PT    Recommendations for Other Services    Frequency Min 5X/week   Plan Discharge plan remains appropriate;Frequency remains appropriate    Precautions / Restrictions Precautions Precautions: Fall Restrictions LLE Weight Bearing: Non weight bearing   Pertinent Vitals/Pain     Mobility  Bed Mobility Bed Mobility: Not assessed Transfers Sit to Stand: 6: Modified independent (Device/Increase time) Stand to Sit: 6: Modified independent (Device/Increase time) Ambulation/Gait Ambulation/Gait Assistance: 4: Min guard Ambulation Distance (Feet): 60 Feet Assistive device: Rolling walker Ambulation/Gait Assistance Details: Patient with LOB x1 with min A to correct. Cues for step length Gait Pattern: Step-to pattern Stairs: Yes Stairs Assistance: 4: Min assist Stairs Assistance Details (indicate cue type and reason): A for RW. Cues for technique Stair Management Technique: Step to pattern;Backwards;With walker Number of Stairs: 4     Exercises     PT Diagnosis:    PT Problem List:   PT Treatment Interventions:     PT Goals Acute Rehab PT Goals PT Goal: Sit to Stand - Progress: Met PT Goal: Stand to Sit - Progress: Met PT Goal: Ambulate - Progress: Progressing toward goal PT Goal: Up/Down Stairs - Progress: Met  Visit Information  Last PT Received On: 03/23/12 Assistance Needed: +1    Subjective  Data      Cognition  Overall Cognitive Status: Appears within functional limits for tasks assessed/performed Arousal/Alertness: Awake/alert Orientation Level: Appears intact for tasks assessed Behavior During Session: [facility] for tasks performed    Balance     End of Session PT - End of Session Equipment Utilized During Treatment: Gait belt Activity Tolerance: Patient tolerated treatment well Patient left: in chair;with call bell/phone within reach Nurse Communication: Mobility status   GP     Fredrich Birks 03/23/2012, 10:18 AM 03/23/2012 Fredrich Birks PTA [phone] pager [phone] office

## 2012-03-24 NOTE — Discharge Summary (Signed)
Physician Discharge Summary  Patient ID: Kristine Cox MRN: [id number] DOB/AGE: [birth year] 72 y.o.  Admit date: 03/21/2012 Discharge date: 03/23/2012  Admission Diagnoses:  Ankle fracture left  Discharge Diagnoses:  Same  Surgeries: Procedure(s): OPEN REDUCTION INTERNAL FIXATION (ORIF) ANKLE FRACTURE on 03/21/2012   Consultants:    Discharged Condition: Stable  Hospital Course: Kristine Cox is an 72 y.o. female who was admitted 03/21/2012 with a chief complaint of ankle pain, and found to have a diagnosis of ankle fracture.  They were brought to the operating room on 03/21/2012 and underwent the above named procedures.Mobilized with PT nwb lle on coumadin for dc to be followed up 7 days.    Antibiotics given:  Anti-infectives     Start     Dose/Rate Route Frequency Ordered Stop   03/21/12 2000   ciprofloxacin (CIPRO) tablet 500 mg        500 mg Oral 2 times daily 03/21/12 1617 03/22/12 1014   03/21/12 1900   ceFAZolin (ANCEF) IVPB 1 g/50 mL premix        1 g 100 mL/hr over 30 Minutes Intravenous 3 times per day 03/21/12 1617 03/22/12 1508   03/21/12 1400   ciprofloxacin (CIPRO) IVPB 400 mg  Status:  Discontinued        400 mg 200 mL/hr over 60 Minutes Intravenous  Once 03/21/12 1347 03/21/12 1616   03/21/12 0600   ceFAZolin (ANCEF) IVPB 2 g/50 mL premix        2 g 100 mL/hr over 30 Minutes Intravenous On call to O.R. 03/20/12 1406 03/21/12 1146        .  Recent vital signs:  Filed Vitals:   03/23/12 0633  BP: 115/60  Pulse: 63  Temp: 98.8 F (37.1 C)  Resp: 18    Recent laboratory studies:  Results for orders placed during the hospital encounter of 03/21/12  PROTIME-INR      Component Value Range   Prothrombin Time 13.6  11.6 - 15.2 seconds   INR 1.05  0.00 - 1.49  PROTIME-INR      Component Value Range   Prothrombin Time 14.3  11.6 - 15.2 seconds   INR 1.13  0.00 - 1.49  PROTIME-INR      Component Value Range   Prothrombin Time 22.4 (*) 11.6 -  15.2 seconds   INR 2.06 (*) 0.00 - 1.49    Discharge Medications:     Medication List     As of 03/24/2012 12:20 PM    STOP taking these medications         aspirin EC 81 MG tablet      oxyCODONE-acetaminophen 5-325 MG per tablet   Commonly known as: PERCOCET/ROXICET      TAKE these medications         donepezil 10 MG tablet   Commonly known as: ARICEPT   Take 10 mg by mouth daily.      fenofibrate 145 MG tablet   Commonly known as: TRICOR   Take 145 mg by mouth daily.      FLUoxetine 20 MG capsule   Commonly known as: PROZAC   Take 20 mg by mouth daily.      methocarbamol 500 MG tablet   Commonly known as: ROBAXIN   Take 1 tablet (500 mg total) by mouth every 6 (six) hours as needed.      oxyCODONE 5 MG immediate release tablet   Commonly known as: Oxy IR/ROXICODONE   Take 1-2 tablets (5-10  mg total) by mouth every 3 (three) hours as needed.      pravastatin 40 MG tablet   Commonly known as: PRAVACHOL   Take 40 mg by mouth daily.      warfarin 5 MG tablet   Commonly known as: COUMADIN   Take 1 tablet (5 mg total) by mouth daily.        Diagnostic Studies: Dg Chest 2 View  03/20/2012  *RADIOLOGY REPORT*  Clinical Data: Preop  CHEST - 2 VIEW  Comparison: 03/21/2005  Findings: Cardiomediastinal silhouette is stable.  No acute infiltrate or pulmonary edema.  Bony thorax is stable.  IMPRESSION: No active disease.  No significant change.   Original Report Authenticated By: Natasha Mead, M.D.    Dg Hip Complete Left  02/29/2012  *RADIOLOGY REPORT*  Clinical Data: Fall 4 days ago back pain, hip pain  LEFT HIP - COMPLETE 2+ VIEW  Comparison: None.  Findings: Three views of the left hip submitted.  No acute fracture or subluxation.  Diffuse osteopenia.  IMPRESSION: No acute fracture or subluxation.   Original Report Authenticated By: Natasha Mead, M.D.    Dg Ankle 2 Views Left  03/21/2012  *RADIOLOGY REPORT*  Clinical Data: Ankle pain  LEFT ANKLE - 2 VIEW,DG C-ARM 61-120  MIN  Comparison: None available.  Findings: C-arm films document plate and screw fixation of a bimalleolar fracture. Anatomic position and alignment.  IMPRESSION: As above.   Original Report Authenticated By: Davonna Belling, M.D.    Ct Head Wo Contrast  02/29/2012  *RADIOLOGY REPORT*  Clinical Data:  Memory loss, recent falls  CT HEAD WITHOUT CONTRAST  Technique:  Contiguous axial images were obtained from the base of the skull through the vertex without contrast  Comparison:  06/27/2007  Findings:  The brain has a normal appearance without evidence for hemorrhage, acute infarction, hydrocephalus, or mass lesion.  There is no extra axial fluid collection.  The skull and paranasal sinuses are normal.  IMPRESSION: Normal CT of the head without contrast.   Original Report Authenticated By: Judie Petit. Miles Costain, M.D.    Dg Ankle Left Port  03/21/2012  *RADIOLOGY REPORT*  Clinical Data: Postop ankle surgery  PORTABLE LEFT ANKLE - 2 VIEW  Comparison: 02/25/2012  Findings: Distal fibular fracture has been repaired with a plate and screws.  There are two screws across the medial malleolar fracture.  The ankle mortise is in satisfactory alignment.  Mildly displaced posterior malleolar fracture has not been surgically repaired.  IMPRESSION: Surgical repair of distal fibula and medial malleolus.  Posterior malleolar fracture has not been surgically repaired.   Original Report Authenticated By: Janeece Riggers, M.D.    Dg C-arm 61-120 Min  03/21/2012  *RADIOLOGY REPORT*  Clinical Data: Ankle pain  LEFT ANKLE - 2 VIEW,DG C-ARM 61-120 MIN  Comparison: None available.  Findings: C-arm films document plate and screw fixation of a bimalleolar fracture. Anatomic position and alignment.  IMPRESSION: As above.   Original Report Authenticated By: Davonna Belling, M.D.     Disposition: 01-Home or Self Care      Discharge Orders    Future Orders Please Complete By Expires   Diet - low sodium heart healthy      Call MD / Call 911       Comments:   If you experience chest pain or shortness of breath, CALL 911 and be transported to the hospital emergency room.  If you develope a fever above 101 F, pus (white drainage) or increased drainage or  redness at the wound, or calf pain, call your surgeon's office.   Constipation Prevention      Comments:   Drink plenty of fluids.  Prune juice may be helpful.  You may use a stool softener, such as Colace (over the counter) 100 mg twice a day.  Use MiraLax (over the counter) for constipation as needed.   Increase activity slowly as tolerated      Discharge instructions      Comments:   1. Non weight bearing left leg 2. Elevate foot 3. Keep splint dry         Signed: Cammy Copa 03/24/2012, 12:20 PM

## 2013-10-01 ENCOUNTER — Other Ambulatory Visit: Payer: Self-pay | Admitting: Gastroenterology

## 2013-10-01 ENCOUNTER — Ambulatory Visit
Admission: RE | Admit: 2013-10-01 | Discharge: 2013-10-01 | Disposition: A | Discharge status: Home / Self Care | Payer: Commercial Managed Care - HMO | Source: Ambulatory Visit | Attending: Gastroenterology | Admitting: Gastroenterology

## 2013-10-01 DIAGNOSIS — R194 Change in bowel habit: Secondary | ICD-10-CM

## 2014-04-30 DIAGNOSIS — D485 Neoplasm of uncertain behavior of skin: Secondary | ICD-10-CM | POA: Diagnosis not present

## 2014-05-11 DIAGNOSIS — R05 Cough: Secondary | ICD-10-CM | POA: Diagnosis not present

## 2014-05-11 DIAGNOSIS — J019 Acute sinusitis, unspecified: Secondary | ICD-10-CM | POA: Diagnosis not present

## 2014-05-16 IMAGING — CR DG HIP (WITH OR WITHOUT PELVIS) 2-3V*L*
3 series · 3 of 3 positions shown · non-contrast
Comparison: None.

CLINICAL DATA: Fall 4 days ago back pain, hip pain

LEFT HIP - COMPLETE 2+ VIEW

[t pelvis ap]
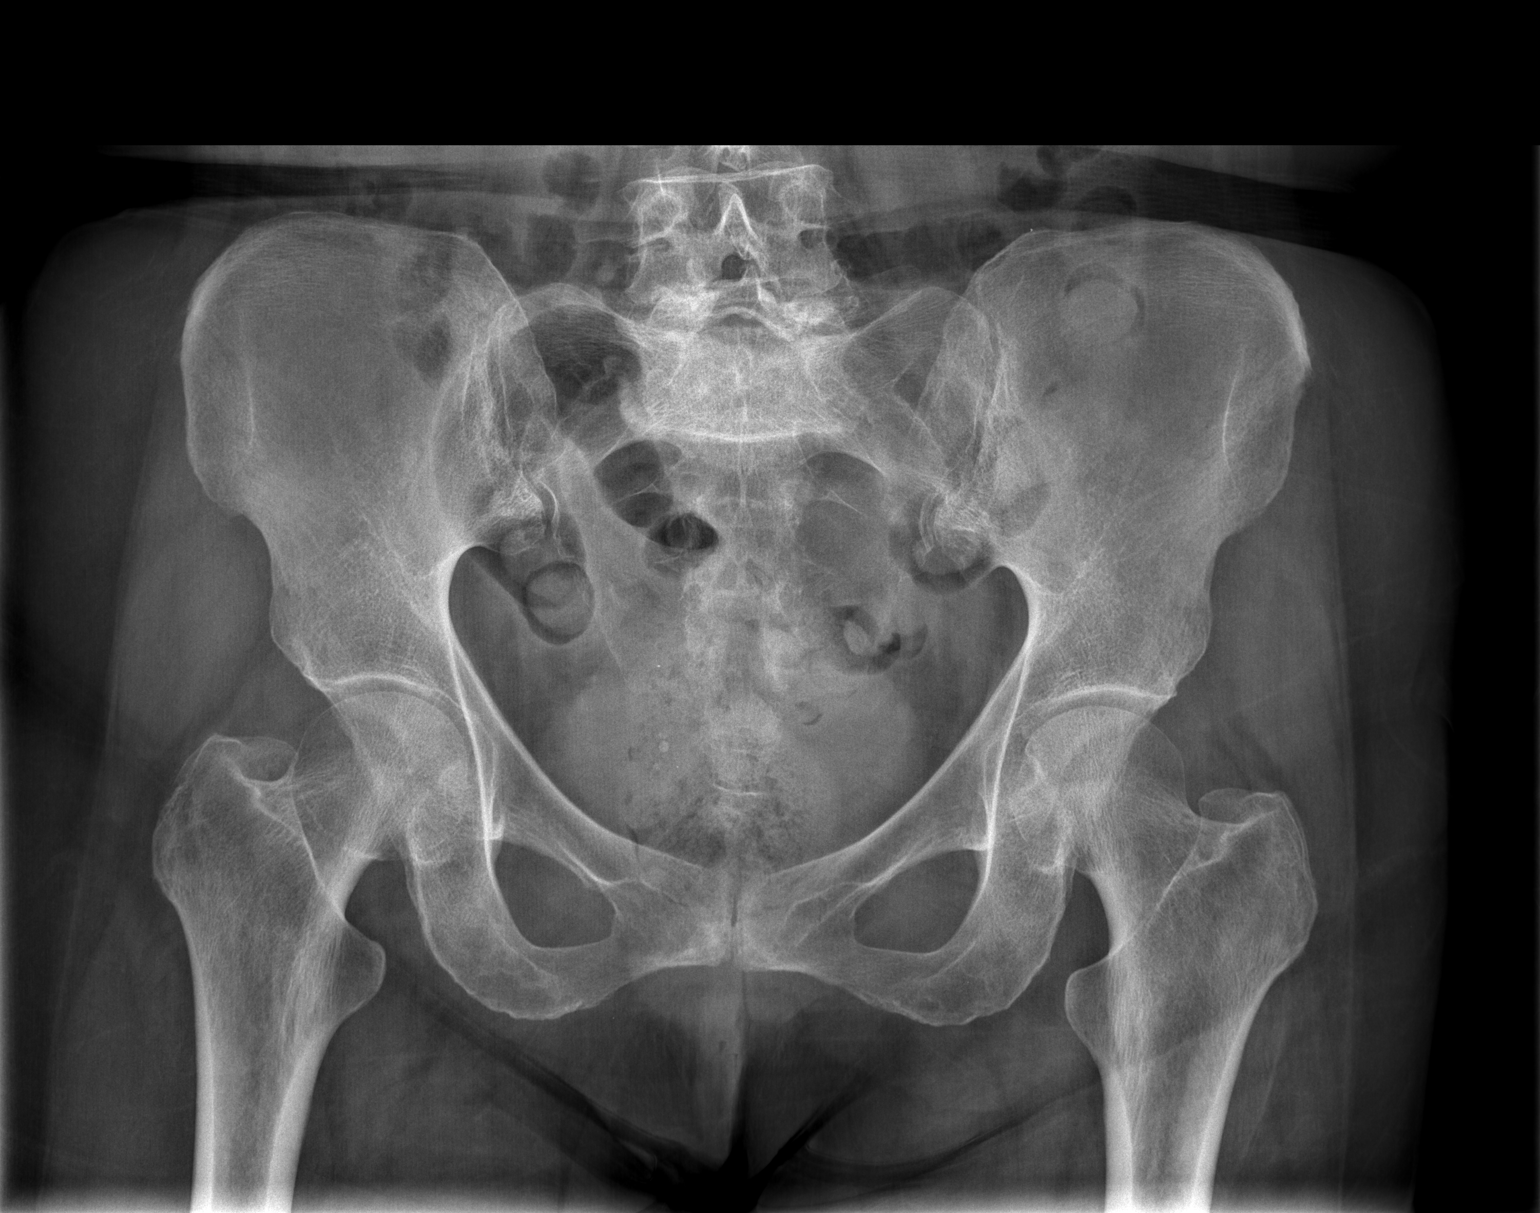

[t hip ap left]
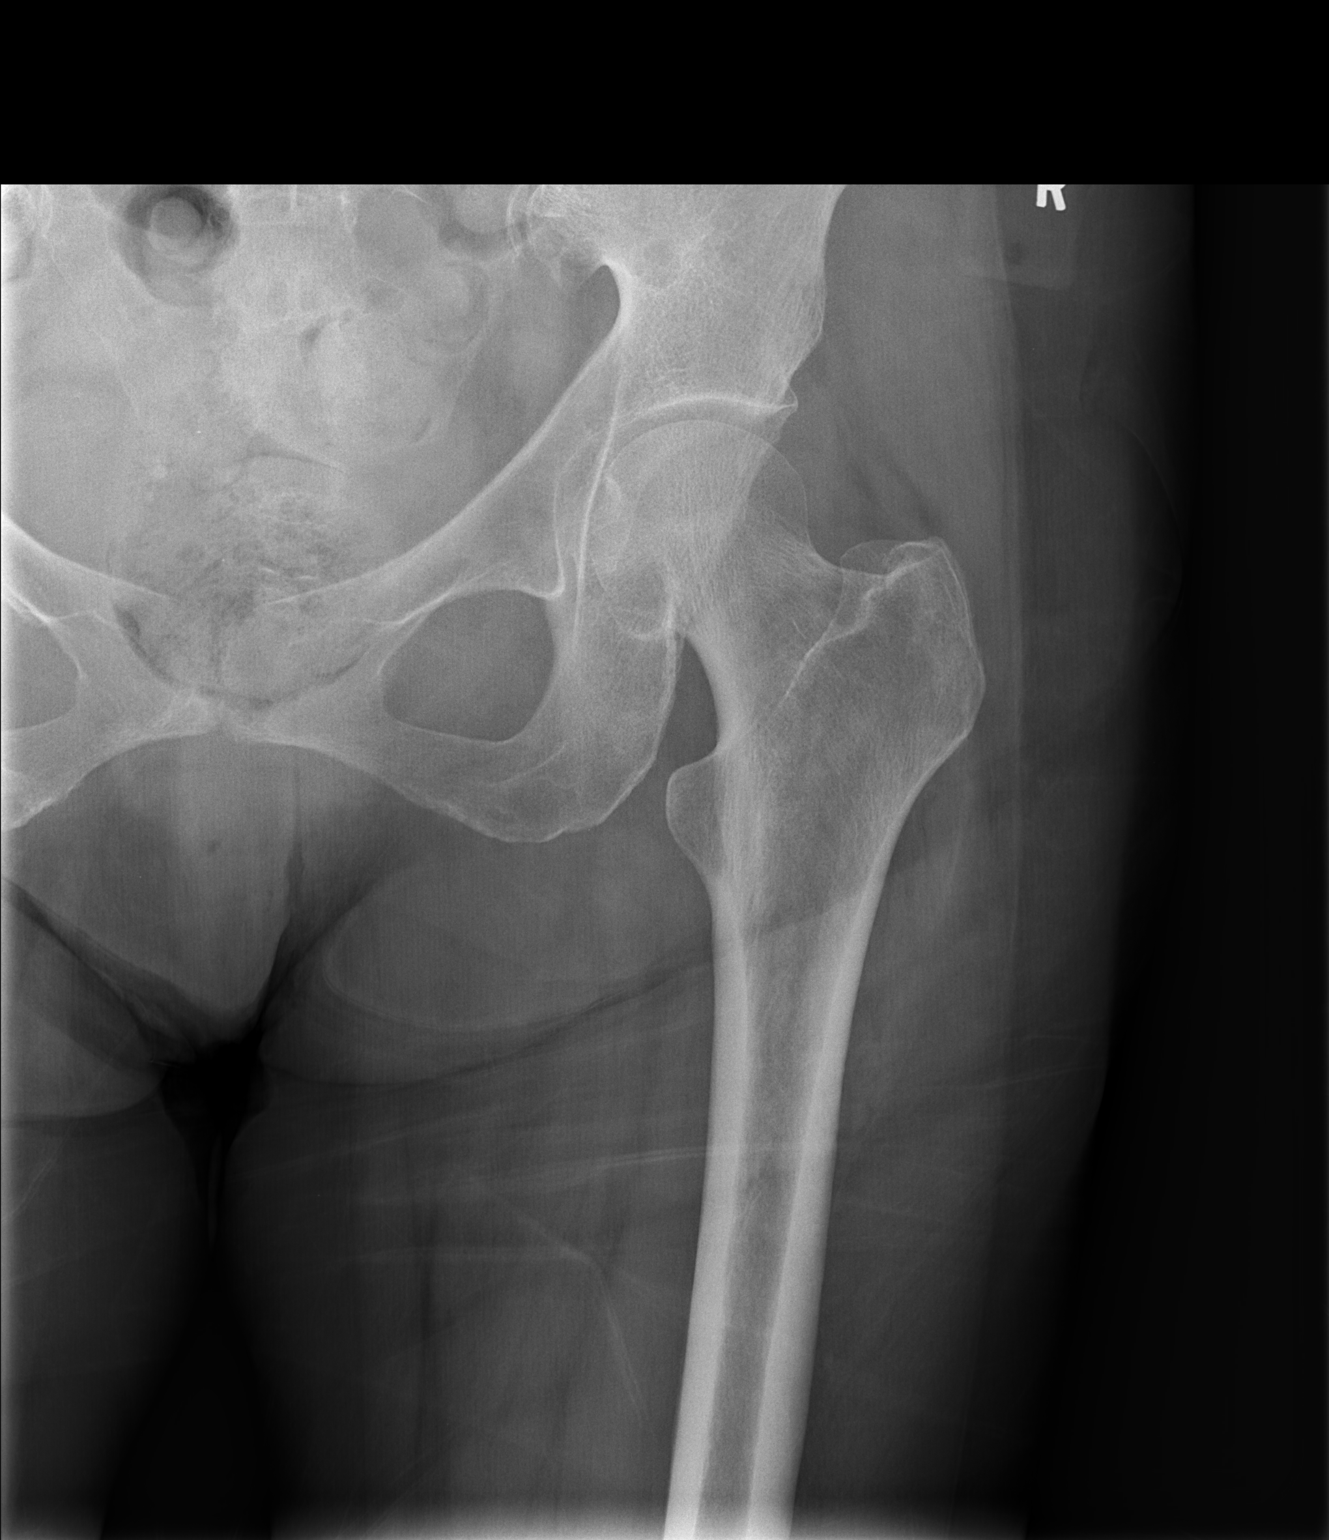

[w hip lat left]
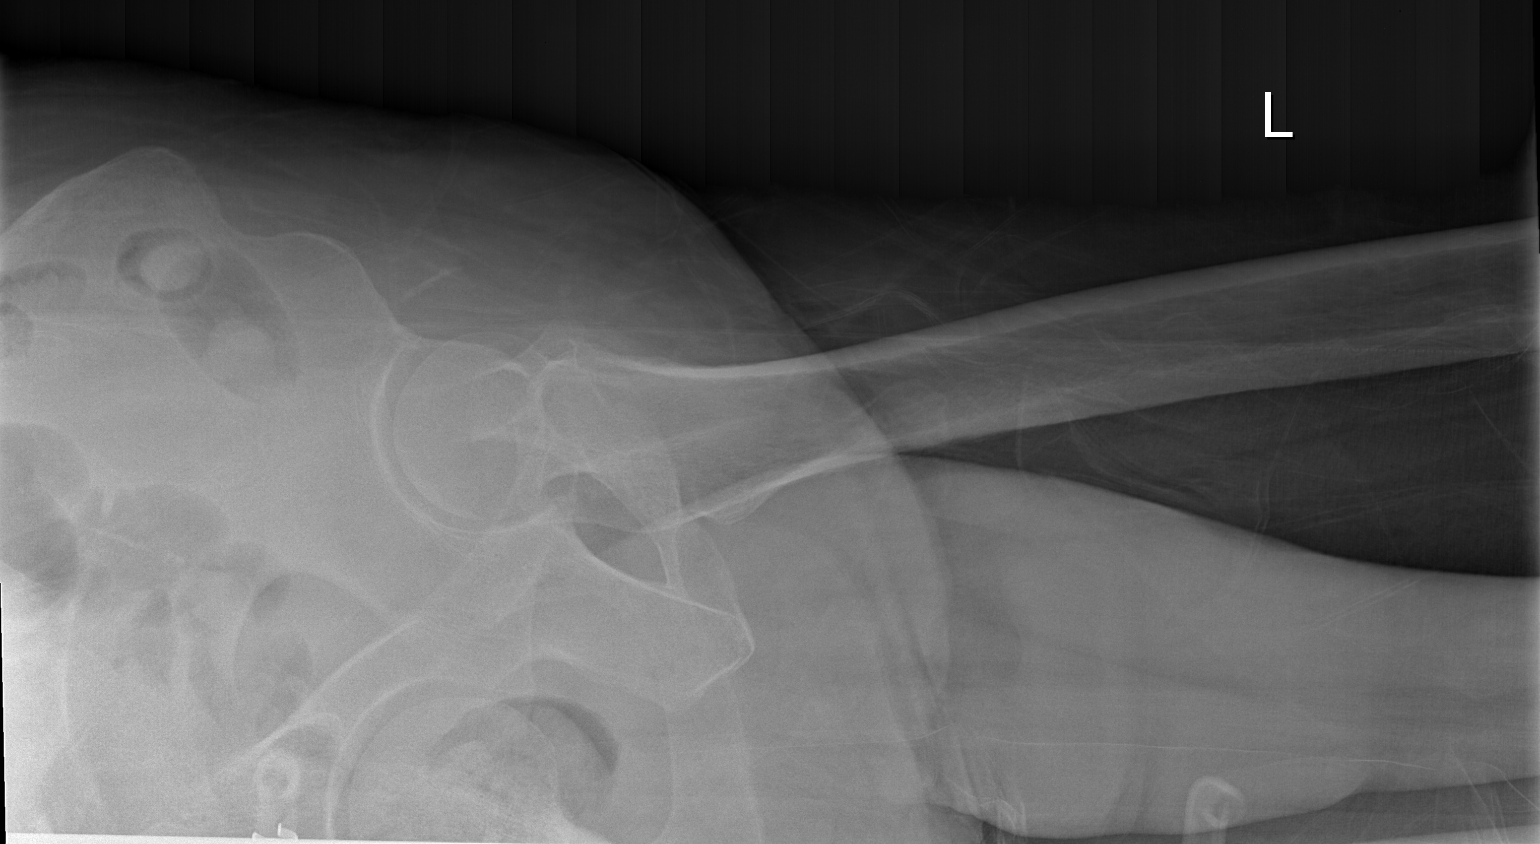

[3 of 3 positions shown; findings below may reference images not displayed]

FINDINGS: Three views of the left hip submitted.  No acute fracture
or subluxation.  Diffuse osteopenia.
IMPRESSION: No acute fracture or subluxation.

## 2014-05-28 DIAGNOSIS — E785 Hyperlipidemia, unspecified: Secondary | ICD-10-CM | POA: Diagnosis not present

## 2014-05-28 DIAGNOSIS — E538 Deficiency of other specified B group vitamins: Secondary | ICD-10-CM | POA: Diagnosis not present

## 2014-05-28 DIAGNOSIS — I10 Essential (primary) hypertension: Secondary | ICD-10-CM | POA: Diagnosis not present

## 2014-06-03 DIAGNOSIS — Z1212 Encounter for screening for malignant neoplasm of rectum: Secondary | ICD-10-CM | POA: Diagnosis not present

## 2014-06-04 DIAGNOSIS — M545 Low back pain: Secondary | ICD-10-CM | POA: Diagnosis not present

## 2014-06-04 DIAGNOSIS — E538 Deficiency of other specified B group vitamins: Secondary | ICD-10-CM | POA: Diagnosis not present

## 2014-06-04 DIAGNOSIS — I10 Essential (primary) hypertension: Secondary | ICD-10-CM | POA: Diagnosis not present

## 2014-06-04 DIAGNOSIS — J302 Other seasonal allergic rhinitis: Secondary | ICD-10-CM | POA: Diagnosis not present

## 2014-06-04 DIAGNOSIS — K219 Gastro-esophageal reflux disease without esophagitis: Secondary | ICD-10-CM | POA: Diagnosis not present

## 2014-06-04 DIAGNOSIS — R413 Other amnesia: Secondary | ICD-10-CM | POA: Diagnosis not present

## 2014-06-04 DIAGNOSIS — Z0000 Encounter for general adult medical examination without abnormal findings: Secondary | ICD-10-CM | POA: Diagnosis not present

## 2014-06-04 DIAGNOSIS — E785 Hyperlipidemia, unspecified: Secondary | ICD-10-CM | POA: Diagnosis not present

## 2014-07-02 DIAGNOSIS — Z780 Asymptomatic menopausal state: Secondary | ICD-10-CM | POA: Diagnosis not present

## 2014-07-02 DIAGNOSIS — Z01419 Encounter for gynecological examination (general) (routine) without abnormal findings: Secondary | ICD-10-CM | POA: Diagnosis not present

## 2014-07-02 DIAGNOSIS — Z1231 Encounter for screening mammogram for malignant neoplasm of breast: Secondary | ICD-10-CM | POA: Diagnosis not present

## 2014-07-12 DIAGNOSIS — J387 Other diseases of larynx: Secondary | ICD-10-CM | POA: Diagnosis not present

## 2014-07-25 DIAGNOSIS — J0100 Acute maxillary sinusitis, unspecified: Secondary | ICD-10-CM | POA: Diagnosis not present

## 2014-08-08 DIAGNOSIS — J0100 Acute maxillary sinusitis, unspecified: Secondary | ICD-10-CM | POA: Diagnosis not present

## 2014-09-20 DIAGNOSIS — Z0100 Encounter for examination of eyes and vision without abnormal findings: Secondary | ICD-10-CM | POA: Diagnosis not present

## 2014-10-15 DIAGNOSIS — J209 Acute bronchitis, unspecified: Secondary | ICD-10-CM | POA: Diagnosis not present

## 2014-10-15 DIAGNOSIS — J0100 Acute maxillary sinusitis, unspecified: Secondary | ICD-10-CM | POA: Diagnosis not present

## 2014-10-24 DIAGNOSIS — J0100 Acute maxillary sinusitis, unspecified: Secondary | ICD-10-CM | POA: Diagnosis not present

## 2014-10-24 DIAGNOSIS — J209 Acute bronchitis, unspecified: Secondary | ICD-10-CM | POA: Diagnosis not present

## 2014-10-28 DIAGNOSIS — J309 Allergic rhinitis, unspecified: Secondary | ICD-10-CM | POA: Diagnosis not present

## 2014-10-28 DIAGNOSIS — J312 Chronic pharyngitis: Secondary | ICD-10-CM | POA: Diagnosis not present

## 2014-10-28 DIAGNOSIS — J029 Acute pharyngitis, unspecified: Secondary | ICD-10-CM | POA: Diagnosis not present

## 2014-10-28 DIAGNOSIS — K209 Esophagitis, unspecified: Secondary | ICD-10-CM | POA: Diagnosis not present

## 2014-11-21 DIAGNOSIS — J309 Allergic rhinitis, unspecified: Secondary | ICD-10-CM | POA: Diagnosis not present

## 2014-12-03 DIAGNOSIS — E538 Deficiency of other specified B group vitamins: Secondary | ICD-10-CM | POA: Diagnosis not present

## 2014-12-03 DIAGNOSIS — D692 Other nonthrombocytopenic purpura: Secondary | ICD-10-CM | POA: Diagnosis not present

## 2014-12-03 DIAGNOSIS — J302 Other seasonal allergic rhinitis: Secondary | ICD-10-CM | POA: Diagnosis not present

## 2014-12-03 DIAGNOSIS — K219 Gastro-esophageal reflux disease without esophagitis: Secondary | ICD-10-CM | POA: Diagnosis not present

## 2014-12-03 DIAGNOSIS — E785 Hyperlipidemia, unspecified: Secondary | ICD-10-CM | POA: Diagnosis not present

## 2014-12-03 DIAGNOSIS — F321 Major depressive disorder, single episode, moderate: Secondary | ICD-10-CM | POA: Diagnosis not present

## 2014-12-03 DIAGNOSIS — M545 Low back pain: Secondary | ICD-10-CM | POA: Diagnosis not present

## 2014-12-03 DIAGNOSIS — R413 Other amnesia: Secondary | ICD-10-CM | POA: Diagnosis not present

## 2014-12-19 DIAGNOSIS — R399 Unspecified symptoms and signs involving the genitourinary system: Secondary | ICD-10-CM | POA: Diagnosis not present

## 2014-12-19 DIAGNOSIS — N3000 Acute cystitis without hematuria: Secondary | ICD-10-CM | POA: Diagnosis not present

## 2014-12-25 DIAGNOSIS — R040 Epistaxis: Secondary | ICD-10-CM | POA: Diagnosis not present

## 2014-12-26 DIAGNOSIS — I10 Essential (primary) hypertension: Secondary | ICD-10-CM | POA: Diagnosis not present

## 2014-12-26 DIAGNOSIS — Z7982 Long term (current) use of aspirin: Secondary | ICD-10-CM | POA: Diagnosis not present

## 2014-12-26 DIAGNOSIS — Z79899 Other long term (current) drug therapy: Secondary | ICD-10-CM | POA: Diagnosis not present

## 2014-12-26 DIAGNOSIS — R040 Epistaxis: Secondary | ICD-10-CM | POA: Diagnosis not present

## 2014-12-26 DIAGNOSIS — F0390 Unspecified dementia without behavioral disturbance: Secondary | ICD-10-CM | POA: Diagnosis not present

## 2014-12-30 DIAGNOSIS — Z7982 Long term (current) use of aspirin: Secondary | ICD-10-CM | POA: Diagnosis not present

## 2014-12-30 DIAGNOSIS — R040 Epistaxis: Secondary | ICD-10-CM | POA: Diagnosis not present

## 2014-12-30 DIAGNOSIS — J342 Deviated nasal septum: Secondary | ICD-10-CM | POA: Diagnosis not present

## 2015-01-06 DIAGNOSIS — R040 Epistaxis: Secondary | ICD-10-CM | POA: Diagnosis not present

## 2015-01-19 DIAGNOSIS — H5210 Myopia, unspecified eye: Secondary | ICD-10-CM | POA: Diagnosis not present

## 2015-01-19 DIAGNOSIS — H5213 Myopia, bilateral: Secondary | ICD-10-CM | POA: Diagnosis not present

## 2015-02-18 DIAGNOSIS — R040 Epistaxis: Secondary | ICD-10-CM | POA: Diagnosis not present

## 2015-02-18 DIAGNOSIS — R58 Hemorrhage, not elsewhere classified: Secondary | ICD-10-CM | POA: Diagnosis not present

## 2015-02-21 DIAGNOSIS — R040 Epistaxis: Secondary | ICD-10-CM | POA: Diagnosis not present

## 2015-02-21 DIAGNOSIS — Z7982 Long term (current) use of aspirin: Secondary | ICD-10-CM | POA: Diagnosis not present

## 2015-04-01 DIAGNOSIS — Z0100 Encounter for examination of eyes and vision without abnormal findings: Secondary | ICD-10-CM | POA: Diagnosis not present

## 2015-04-02 DIAGNOSIS — N3001 Acute cystitis with hematuria: Secondary | ICD-10-CM | POA: Diagnosis not present

## 2015-04-02 DIAGNOSIS — R3915 Urgency of urination: Secondary | ICD-10-CM | POA: Diagnosis not present

## 2015-05-26 DIAGNOSIS — J069 Acute upper respiratory infection, unspecified: Secondary | ICD-10-CM | POA: Diagnosis not present

## 2015-05-30 DIAGNOSIS — R8299 Other abnormal findings in urine: Secondary | ICD-10-CM | POA: Diagnosis not present

## 2015-05-30 DIAGNOSIS — N390 Urinary tract infection, site not specified: Secondary | ICD-10-CM | POA: Diagnosis not present

## 2015-05-30 DIAGNOSIS — E784 Other hyperlipidemia: Secondary | ICD-10-CM | POA: Diagnosis not present

## 2015-05-30 DIAGNOSIS — E538 Deficiency of other specified B group vitamins: Secondary | ICD-10-CM | POA: Diagnosis not present

## 2015-05-30 DIAGNOSIS — I10 Essential (primary) hypertension: Secondary | ICD-10-CM | POA: Diagnosis not present

## 2015-06-06 DIAGNOSIS — Z6820 Body mass index (BMI) 20.0-20.9, adult: Secondary | ICD-10-CM | POA: Diagnosis not present

## 2015-06-06 DIAGNOSIS — R808 Other proteinuria: Secondary | ICD-10-CM | POA: Diagnosis not present

## 2015-06-06 DIAGNOSIS — Z0000 Encounter for general adult medical examination without abnormal findings: Secondary | ICD-10-CM | POA: Diagnosis not present

## 2015-06-06 DIAGNOSIS — E7800 Pure hypercholesterolemia, unspecified: Secondary | ICD-10-CM | POA: Diagnosis not present

## 2015-06-06 DIAGNOSIS — N182 Chronic kidney disease, stage 2 (mild): Secondary | ICD-10-CM | POA: Diagnosis not present

## 2015-06-06 DIAGNOSIS — M545 Low back pain: Secondary | ICD-10-CM | POA: Diagnosis not present

## 2015-06-06 DIAGNOSIS — I1310 Hypertensive heart and chronic kidney disease without heart failure, with stage 1 through stage 4 chronic kidney disease, or unspecified chronic kidney disease: Secondary | ICD-10-CM | POA: Diagnosis not present

## 2015-06-06 DIAGNOSIS — D72829 Elevated white blood cell count, unspecified: Secondary | ICD-10-CM | POA: Diagnosis not present

## 2015-06-06 DIAGNOSIS — D692 Other nonthrombocytopenic purpura: Secondary | ICD-10-CM | POA: Diagnosis not present

## 2015-06-11 DIAGNOSIS — R300 Dysuria: Secondary | ICD-10-CM | POA: Diagnosis not present

## 2015-06-11 DIAGNOSIS — N3090 Cystitis, unspecified without hematuria: Secondary | ICD-10-CM | POA: Diagnosis not present

## 2015-06-18 DIAGNOSIS — S0500XA Injury of conjunctiva and corneal abrasion without foreign body, unspecified eye, initial encounter: Secondary | ICD-10-CM | POA: Diagnosis not present

## 2015-06-25 DIAGNOSIS — J324 Chronic pansinusitis: Secondary | ICD-10-CM | POA: Diagnosis not present

## 2015-07-03 DIAGNOSIS — H10019 Acute follicular conjunctivitis, unspecified eye: Secondary | ICD-10-CM | POA: Diagnosis not present

## 2015-07-18 ENCOUNTER — Encounter (HOSPITAL_COMMUNITY): Payer: Self-pay

## 2015-07-18 ENCOUNTER — Emergency Department (HOSPITAL_COMMUNITY)
Admission: EM | Admit: 2015-07-18 | Discharge: 2015-07-18 | Discharge status: Against Medical Advice | Payer: Commercial Managed Care - HMO | Attending: Emergency Medicine | Admitting: Emergency Medicine

## 2015-07-18 DIAGNOSIS — H1089 Other conjunctivitis: Secondary | ICD-10-CM | POA: Diagnosis not present

## 2015-07-18 DIAGNOSIS — Z7901 Long term (current) use of anticoagulants: Secondary | ICD-10-CM | POA: Insufficient documentation

## 2015-07-18 DIAGNOSIS — E785 Hyperlipidemia, unspecified: Secondary | ICD-10-CM | POA: Insufficient documentation

## 2015-07-18 DIAGNOSIS — I10 Essential (primary) hypertension: Secondary | ICD-10-CM | POA: Diagnosis not present

## 2015-07-18 DIAGNOSIS — Z79899 Other long term (current) drug therapy: Secondary | ICD-10-CM | POA: Diagnosis not present

## 2015-07-18 DIAGNOSIS — F329 Major depressive disorder, single episode, unspecified: Secondary | ICD-10-CM | POA: Insufficient documentation

## 2015-07-18 DIAGNOSIS — Z6820 Body mass index (BMI) 20.0-20.9, adult: Secondary | ICD-10-CM | POA: Diagnosis not present

## 2015-07-18 DIAGNOSIS — R42 Dizziness and giddiness: Secondary | ICD-10-CM | POA: Insufficient documentation

## 2015-07-18 DIAGNOSIS — Z8719 Personal history of other diseases of the digestive system: Secondary | ICD-10-CM | POA: Insufficient documentation

## 2015-07-18 DIAGNOSIS — H5711 Ocular pain, right eye: Secondary | ICD-10-CM | POA: Diagnosis not present

## 2015-07-18 DIAGNOSIS — H5789 Other specified disorders of eye and adnexa: Secondary | ICD-10-CM

## 2015-07-18 DIAGNOSIS — H578 Other specified disorders of eye and adnexa: Secondary | ICD-10-CM | POA: Diagnosis not present

## 2015-07-18 DIAGNOSIS — I1310 Hypertensive heart and chronic kidney disease without heart failure, with stage 1 through stage 4 chronic kidney disease, or unspecified chronic kidney disease: Secondary | ICD-10-CM | POA: Diagnosis not present

## 2015-07-18 DIAGNOSIS — Z87891 Personal history of nicotine dependence: Secondary | ICD-10-CM | POA: Insufficient documentation

## 2015-07-18 DIAGNOSIS — N182 Chronic kidney disease, stage 2 (mild): Secondary | ICD-10-CM | POA: Diagnosis not present

## 2015-07-18 DIAGNOSIS — F0390 Unspecified dementia without behavioral disturbance: Secondary | ICD-10-CM | POA: Diagnosis not present

## 2015-07-18 LAB — BASIC METABOLIC PANEL
Anion gap: 7 (ref 5–15)
BUN: 17 mg/dL (ref 6–20)
CALCIUM: 9.3 mg/dL (ref 8.9–10.3)
CO2: 28 mmol/L (ref 22–32)
CREATININE: 0.72 mg/dL (ref 0.44–1.00)
Chloride: 103 mmol/L (ref 101–111)
GFR calc non Af Amer: >60 mL/min (ref >60)
Glucose, Bld: 122 mg/dL — ABNORMAL HIGH (ref 65–99)
Potassium: 4 mmol/L (ref 3.5–5.1)
SODIUM: 138 mmol/L (ref 135–145)

## 2015-07-18 LAB — URINALYSIS, ROUTINE W REFLEX MICROSCOPIC
Bilirubin Urine: NEGATIVE
GLUCOSE, UA: NEGATIVE mg/dL
Hgb urine dipstick: NEGATIVE
Ketones, ur: NEGATIVE mg/dL
Nitrite: NEGATIVE
PH: 6 (ref 5.0–8.0)
Protein, ur: NEGATIVE mg/dL
Specific Gravity, Urine: 1.02 (ref 1.005–1.030)

## 2015-07-18 LAB — CBC
HCT: 44.1 % (ref 36.0–46.0)
Hemoglobin: 15 g/dL (ref 12.0–15.0)
MCH: 28.5 pg (ref 26.0–34.0)
MCHC: 34 g/dL (ref 30.0–36.0)
MCV: 83.8 fL (ref 78.0–100.0)
Platelets: 301 10*3/uL (ref 150–400)
RBC: 5.26 MIL/uL — AB (ref 3.87–5.11)
RDW: 16.3 % — ABNORMAL HIGH (ref 11.5–15.5)
WBC: 15.5 10*3/uL — AB (ref 4.0–10.5)

## 2015-07-18 LAB — CBG MONITORING, ED: Glucose-Capillary: 124 mg/dL — ABNORMAL HIGH (ref 65–99)

## 2015-07-18 LAB — URINE MICROSCOPIC-ADD ON

## 2015-07-18 NOTE — ED Notes (Signed)
Pt with dizziness for months.  Pt denies n/v/d.  Pt has redness and irritation to right eye.  Pt was seen at MD office today and told to come here.  Unknown for reasons of her vertigo.  No ear pain or fullness.  Denies fever.  Pt feels like she will pass out at times.

## 2015-07-18 NOTE — ED Provider Notes (Signed)
CSN: [id number]     Arrival date & time 07/18/15  1726 History   First MD Initiated Contact with Patient 07/18/15 1841     Chief Complaint  Patient presents with  . Dizziness     (Consider location/radiation/quality/duration/timing/severity/associated sxs/prior Treatment) HPI   Eye redness began a few days ago, difficult to see. Took contact lenses out today on arrival to ED.  Stinging pain. +drainage.  No fever.  Do not believe foreign body. Eye sore for 1 week.  Went to urgent care, got drops, went for recheck and got more drops.  Haven't seen an eye doctor.  Went to [facility] who referred her to ED.   Past Medical History  Diagnosis Date  . Hypertension   . Depression   . Hyperlipidemia   . Family history of anesthesia complication     sister had hard time waking up "  . Mitral valve prolapse   . GERD (gastroesophageal reflux disease)    Past Surgical History  Procedure Laterality Date  . Tonsillectomy    . Abdominal hysterectomy    . Cholecystectomy    . Orif ankle fracture  03/21/2012  . Orif ankle fracture  03/21/2012    Procedure: OPEN REDUCTION INTERNAL FIXATION (ORIF) ANKLE FRACTURE;  Surgeon: Meredith Pel, MD;  Location: [place];  Service: Orthopedics;  Laterality: Left;  Open Reduction Internal Fixation left ankle fracture   History reviewed. No pertinent family history. Social History  Substance Use Topics  . Smoking status: Former Smoker    Types: Cigarettes    Quit date: 03/21/1966  . Smokeless tobacco: Never Used  . Alcohol Use: No   OB History    No data available     Review of Systems  Eyes: Positive for redness and visual disturbance. Negative for photophobia.      Allergies  Succinylcholine  Home Medications   Prior to Admission medications   Medication Sig Start Date End Date Taking? Authorizing Provider  fenofibrate (TRICOR) 145 MG tablet Take 145 mg by mouth daily.   Yes Historical Provider, MD  FLUoxetine (PROZAC) 20 MG  capsule Take 20 mg by mouth daily.   Yes Historical Provider, MD  NAMZARIC 28-10 MG CP24 Take 1 capsule by mouth daily. 07/11/15  Yes Historical Provider, MD  pravastatin (PRAVACHOL) 40 MG tablet Take 40 mg by mouth daily.   Yes Historical Provider, MD  traMADol (ULTRAM) 50 MG tablet Take 50 mg by mouth every 6 (six) hours as needed for moderate pain or severe pain.  07/03/15  Yes Historical Provider, MD  warfarin (COUMADIN) 5 MG tablet Take 1 tablet (5 mg total) by mouth daily. Patient not taking: Reported on 07/18/2015 03/23/12   Meredith Pel, MD   BP 135/97 mmHg  Pulse 75  Temp(Src) 98.9 F (37.2 C) (Oral)  Resp 18  SpO2 97% Physical Exam  Eyes: Right conjunctiva is injected.    ED Course  Procedures (including critical care time) Labs Review Labs Reviewed  BASIC METABOLIC PANEL - Abnormal; Notable for the following:    Glucose, Bld 122 (*)    All other components within normal limits  CBC - Abnormal; Notable for the following:    WBC 15.5 (*)    RBC 5.26 (*)    RDW 16.3 (*)    All other components within normal limits  URINALYSIS, ROUTINE W REFLEX MICROSCOPIC (NOT AT [facility]) - Abnormal; Notable for the following:    Leukocytes, UA SMALL (*)    All other  components within normal limits  URINE MICROSCOPIC-ADD ON - Abnormal; Notable for the following:    Squamous Epithelial / LPF 0-5 (*)    Bacteria, UA RARE (*)    All other components within normal limits  CBG MONITORING, ED - Abnormal; Notable for the following:    Glucose-Capillary 124 (*)    All other components within normal limits    Imaging Review No results found. I have personally reviewed and evaluated these images and lab results as part of my medical decision-making.   EKG Interpretation   Date/Time:  Friday Jul 18 2015 18:28:07 EDT Ventricular Rate:  78 PR Interval:  119 QRS Duration: 80 QT Interval:  376 QTC Calculation: 428 R Axis:   65 Text Interpretation:  Sinus rhythm Atrial premature complex  Borderline  short PR interval Borderline T abnormalities, anterior leads Confirmed by  DELO  MD, DOUGLAS ([id number]) on 07/19/2015 7:32:14 PM      MDM   Final diagnoses:  Eye redness  Eye pain, right    75 year old female with history of hypertension, depression, hyperlipidemia presents with concern for right eye pain and erythema for 1 week. Patient also noted in triage that she's had dizziness for months, and given this, labs are obtained which showed no significant findings. I was unable to complete an exam, or focal evaluation of patient, as patient in her daughter decided to elope from the emergency department after I received 2 calls regarding emergent patient care in the ED.  Given this, cannot confidently diagnose or rule out all emergent pathology, however history at this time in contact lens wearer who has trialed abx eye drops from urgent care is concerning for possible corneal ulcer.  Attempted to call patient at home, however unable to reach her. Would recommend Opthalmology follow up and discontinuing contact lens however unable to discuss this with patient prior to her elopement.    Gareth Morgan, MD 07/20/15 [phone]

## 2015-07-18 NOTE — ED Notes (Signed)
Pt and pt's family member walked out of room and left ED---- both very upset for long-hour wait despite explanation of delay.  Pt was seen by EDP at bedside earlier but EDP had to answer an emergency phone call.

## 2015-08-05 DIAGNOSIS — M25561 Pain in right knee: Secondary | ICD-10-CM | POA: Diagnosis not present

## 2015-08-29 DIAGNOSIS — N3090 Cystitis, unspecified without hematuria: Secondary | ICD-10-CM | POA: Diagnosis not present

## 2015-12-12 DIAGNOSIS — F321 Major depressive disorder, single episode, moderate: Secondary | ICD-10-CM | POA: Diagnosis not present

## 2015-12-12 DIAGNOSIS — F0390 Unspecified dementia without behavioral disturbance: Secondary | ICD-10-CM | POA: Diagnosis not present

## 2015-12-12 DIAGNOSIS — I10 Essential (primary) hypertension: Secondary | ICD-10-CM | POA: Diagnosis not present

## 2015-12-12 DIAGNOSIS — Z6821 Body mass index (BMI) 21.0-21.9, adult: Secondary | ICD-10-CM | POA: Diagnosis not present

## 2015-12-12 DIAGNOSIS — R808 Other proteinuria: Secondary | ICD-10-CM | POA: Diagnosis not present

## 2015-12-12 DIAGNOSIS — Z23 Encounter for immunization: Secondary | ICD-10-CM | POA: Diagnosis not present

## 2015-12-12 DIAGNOSIS — I1310 Hypertensive heart and chronic kidney disease without heart failure, with stage 1 through stage 4 chronic kidney disease, or unspecified chronic kidney disease: Secondary | ICD-10-CM | POA: Diagnosis not present

## 2015-12-12 DIAGNOSIS — E7800 Pure hypercholesterolemia, unspecified: Secondary | ICD-10-CM | POA: Diagnosis not present

## 2016-02-20 DIAGNOSIS — I1310 Hypertensive heart and chronic kidney disease without heart failure, with stage 1 through stage 4 chronic kidney disease, or unspecified chronic kidney disease: Secondary | ICD-10-CM | POA: Diagnosis not present

## 2016-02-20 DIAGNOSIS — K219 Gastro-esophageal reflux disease without esophagitis: Secondary | ICD-10-CM | POA: Diagnosis not present

## 2016-02-20 DIAGNOSIS — E538 Deficiency of other specified B group vitamins: Secondary | ICD-10-CM | POA: Diagnosis not present

## 2016-02-20 DIAGNOSIS — I10 Essential (primary) hypertension: Secondary | ICD-10-CM | POA: Diagnosis not present

## 2016-02-20 DIAGNOSIS — D692 Other nonthrombocytopenic purpura: Secondary | ICD-10-CM | POA: Diagnosis not present

## 2016-02-20 DIAGNOSIS — F0390 Unspecified dementia without behavioral disturbance: Secondary | ICD-10-CM | POA: Diagnosis not present

## 2016-02-20 DIAGNOSIS — D72829 Elevated white blood cell count, unspecified: Secondary | ICD-10-CM | POA: Diagnosis not present

## 2016-02-20 DIAGNOSIS — R808 Other proteinuria: Secondary | ICD-10-CM | POA: Diagnosis not present

## 2016-02-20 DIAGNOSIS — N182 Chronic kidney disease, stage 2 (mild): Secondary | ICD-10-CM | POA: Diagnosis not present

## 2016-03-31 DIAGNOSIS — J209 Acute bronchitis, unspecified: Secondary | ICD-10-CM | POA: Diagnosis not present

## 2016-05-13 DIAGNOSIS — H40033 Anatomical narrow angle, bilateral: Secondary | ICD-10-CM | POA: Diagnosis not present

## 2016-05-13 DIAGNOSIS — H04123 Dry eye syndrome of bilateral lacrimal glands: Secondary | ICD-10-CM | POA: Diagnosis not present

## 2016-05-14 ENCOUNTER — Ambulatory Visit (INDEPENDENT_AMBULATORY_CARE_PROVIDER_SITE_OTHER): Payer: Medicare HMO | Admitting: Neurology

## 2016-05-14 ENCOUNTER — Encounter: Payer: Self-pay | Admitting: Neurology

## 2016-05-14 VITALS — BP 130/70 | HR 68 | Ht 63.5 in | Wt 134.0 lb

## 2016-05-14 DIAGNOSIS — F320 Major depressive disorder, single episode, mild: Secondary | ICD-10-CM | POA: Diagnosis not present

## 2016-05-14 DIAGNOSIS — F0390 Unspecified dementia without behavioral disturbance: Secondary | ICD-10-CM | POA: Diagnosis not present

## 2016-05-14 DIAGNOSIS — F03A0 Unspecified dementia, mild, without behavioral disturbance, psychotic disturbance, mood disturbance, and anxiety: Secondary | ICD-10-CM

## 2016-05-14 NOTE — Progress Notes (Signed)
NEUROLOGY CONSULTATION NOTE  Kristine Cox MRN: [id number] DOB: Jan 20, 1941  Referring provider: Dr. Domenick Gong Primary care provider: Dr. Domenick Gong  Reason for consult:  dementia  Dear Dr Osborne Casco:  Thank you for your kind referral of Kristine Cox for consultation of the above symptoms. Although her history is well known to you, please allow me to reiterate it for the purpose of our medical record. The patient was accompanied to the clinic by her sister and niece who also provide collateral information. Records and images were personally reviewed where available.  HISTORY OF PRESENT ILLNESS: This is a pleasant 76 year old right-handed woman with a history of hypertension, hyperlipidemia, mitral valve prolapse, depression, presenting for evaluation of dementia. She states her memory is "leaving me slowly," she does not remember a lot of stuff. She feels symptoms started a year or so ago. Her sister started noticing changes in 2009 and had her evaluated at [facility] by Dr. Lavone Neri. At that time, MOCA score was 19/30, indicating mild cognitive impairment. At that time, she was having mild memory problems exacerbated by profound level of depression, and treatment of depression was recommended. Over the course of the years, memory had progressively worsened and she was started on Aricept then switched to [facility] by her PCP. She is tolerating this without side effects. She continues to live alone but family visits frequently, or she would stay over with them. Family reports she has a timed pillbox because in the past she took 3 days worth of her medications on the regular pillbox. She started having visual hallucinations around 6 months ago (seeing people or a dog in her room), and a neighbor has started coming at night to give her the evening dose of Depakote, which has helped with the hallucinations. She has had only one episode of visual hallucination since  starting Depakote when she asked why the stove was in the living room. There is some paranoia, she would state she does not want her daughter around because she thinks her daughter will take something from her. She denies any missed bill payments but family has taken this over. She was crying yesterday because she could not see, it turns out she had a contact lens rolled up in her eye, however family reports she has not used contact lenses in a long time. She continues to be independent with dressing and bathing, she does her own laundry without difficulties. She drives only short distances in their town of [place], but family is scared she would take the wrong turn. A year ago she called to say she was lost and stopped a service station all the way in [facility]. Last Fall, she got lost trying to go to church. She has not driven to church or out of [place] since then. She reports that she gets depressed being by herself "all the time, 24/7." Her family reminds her that they see her often and spend every weekend with her, but she forgets this. She states "depression is the worst, she hates living by herself." She denies any suicidal ideation. She has fluctuations where she gets very happy then "really, really down." She is taking fluoxetine 20mg  daily for depression.    She has left-sided neck pain and back/shoulder pain. She feels like there is something in her throat, and apparently one time told her PCP's nurse she would drink Clorox to get rid of the throat sensation. She was having nosebleeds and aspirin was  stopped. She denies any falls. She denies any headaches, dizziness, diplopia, focal numbness/tingling/weakness, bowel/bladder dysfunction, anosmia, or tremors. There is no family history of dementia, she has no history of significant head injuries or alcohol use.   Laboratory Data: I am unable to load the images for MRI brain done 06/2007, per Dr. Rudene Re note MRI showed high degree of changes in  nonspecific white matter hyperintensity likely from small vessel disease. There is a PET scan done  10/2007 which showed mild nonspecific decreased uptake within the medial-anterior temporal lobes.  PAST MEDICAL HISTORY: Past Medical History:  Diagnosis Date  . Depression   . Family history of anesthesia complication    sister had hard time waking up "  . GERD (gastroesophageal reflux disease)   . Hyperlipidemia   . Hypertension   . Mitral valve prolapse     PAST SURGICAL HISTORY: Past Surgical History:  Procedure Laterality Date  . ABDOMINAL HYSTERECTOMY    . CHOLECYSTECTOMY    . ORIF ANKLE FRACTURE  03/21/2012  . ORIF ANKLE FRACTURE  03/21/2012   Procedure: OPEN REDUCTION INTERNAL FIXATION (ORIF) ANKLE FRACTURE;  Surgeon: Meredith Pel, MD;  Location: [place];  Service: Orthopedics;  Laterality: Left;  Open Reduction Internal Fixation left ankle fracture  . TONSILLECTOMY      MEDICATIONS: Current Outpatient Prescriptions on File Prior to Visit  Medication Sig Dispense Refill  . fenofibrate (TRICOR) 145 MG tablet Take 145 mg by mouth daily.    Marland Kitchen FLUoxetine (PROZAC) 20 MG capsule Take 20 mg by mouth daily.    Marland Kitchen NAMZARIC 28-10 MG CP24 Take 1 capsule by mouth daily.  6  . pravastatin (PRAVACHOL) 40 MG tablet Take 40 mg by mouth daily.     No current facility-administered medications on file prior to visit.     ALLERGIES: Allergies  Allergen Reactions  . Succinylcholine Other (See Comments)    Unknown reaction     FAMILY HISTORY: Family History  Problem Relation Age of Onset  . Melanoma Mother   . Stroke Father   . Heart disease Father   . Cancer Sister   . Cancer Brother   . Cancer Sister     SOCIAL HISTORY: Social History   Social History  . Marital status: Widowed    Spouse name: N/A  . Number of children: N/A  . Years of education: N/A   Occupational History  . Not on file.   Social History Main Topics  . Smoking status: Former Smoker    Types:  Cigarettes    Quit date: 03/21/1966  . Smokeless tobacco: Never Used  . Alcohol use No  . Drug use: No  . Sexual activity: Not on file   Other Topics Concern  . Not on file   Social History Narrative  . No narrative on file    REVIEW OF SYSTEMS: Constitutional: No fevers, chills, or sweats, no generalized fatigue, change in appetite Eyes: No visual changes, double vision, eye pain Ear, nose and throat: No hearing loss, ear pain, nasal congestion, sore throat Cardiovascular: No chest pain, palpitations Respiratory:  No shortness of breath at rest or with exertion, wheezes GastrointestinaI: No nausea, vomiting, diarrhea, abdominal pain, fecal incontinence Genitourinary:  No dysuria, urinary retention or frequency Musculoskeletal:  No neck pain, back pain Integumentary: No rash, pruritus, skin lesions Neurological: as above Psychiatric: No depression, insomnia, anxiety Endocrine: No palpitations, fatigue, diaphoresis, mood swings, change in appetite, change in weight, increased thirst Hematologic/Lymphatic:  No anemia, purpura, petechiae. Allergic/Immunologic:  no itchy/runny eyes, nasal congestion, recent allergic reactions, rashes  PHYSICAL EXAM: Vitals:   05/14/16 1034  BP: 130/70  Pulse: 68   General: No acute distress Head:  Normocephalic/atraumatic Eyes: Fundoscopic exam shows bilateral sharp discs, no vessel changes, exudates, or hemorrhages Neck: supple, no paraspinal tenderness, full range of motion Back: No paraspinal tenderness Heart: regular rate and rhythm Lungs: Clear to auscultation bilaterally. Vascular: No carotid bruits. Skin/Extremities: No rash, no edema Neurological Exam: Mental status: alert and oriented to person, place, no dysarthria or aphasia, Fund of knowledge is appropriate.  Recent and remote memory are impaired.  Attention and concentration are normal.    Able to name objects and repeat phrases.  Cranial nerves: CN I: not tested CN II: pupils  equal, round and reactive to light, visual fields intact, fundi unremarkable. CN III, IV, VI:  full range of motion, no nystagmus, no ptosis CN V: facial sensation intact CN VII: upper and lower face symmetric CN VIII: hearing intact to finger rub CN IX, X: gag intact, uvula midline CN XI: sternocleidomastoid and trapezius muscles intact CN XII: tongue midline Bulk & Tone: normal, no cogwheeling, no fasciculations. Motor: 5/5 throughout with no pronator drift. Sensation: intact to light touch, cold, pin, vibration and joint position sense.  No extinction to double simultaneous stimulation.  Romberg test negative Deep Tendon Reflexes: +2 throughout, no ankle clonus Plantar responses: downgoing bilaterally Cerebellar: no incoordination on finger to nose, heel to shin. No dysdiadochokinesia Gait: narrow-based and steady, able to tandem walk adequately. Tremor: none  IMPRESSION: This is a pleasant 76 year old right-handed woman with a history of hypertension, hyperlipidemia, mitral valve prolapse, depression, presenting for evaluation of dementia. Her MOCA score today is 13/30 (19/30 in March 2010), indicating mild dementia. She has gotten lost driving, family is now managing her medications and bills. She is taking Namzaric without side effects, continue medication. We discussed behavioral changes that occur over the course of dementia, she has had good response to Depakote. We discussed depression and management of symptoms, she is agreeable to seeing geriatric psychiatrist Dr. Casimiro Needle, referral will be sent. We had an extensive discussion regarding diagnosis and prognosis, as well as home safety. Recommend 24/7 care and no further driving. We discussed the importance of control of vascular risk factors, physical exercise, and brain stimulation exercises. She will follow-up in 1 year and knows to call for any changes.   Thank you for allowing me to participate in the care of this patient. Please do  not hesitate to call for any questions or concerns.   Ellouise Newer, M.D.  CC: Dr. Osborne Casco

## 2016-05-14 NOTE — Patient Instructions (Signed)
1. Continue Namzaric 2. Refer to Dr. Casimiro Needle for depression 3. Recommend 24/7 care 4. Recommend no further driving  5. Physical exercise and brain stimulation exercises are important for brain health 6. Follow-up in 1 year

## 2016-05-17 ENCOUNTER — Encounter: Payer: Self-pay | Admitting: Neurology

## 2016-05-19 ENCOUNTER — Encounter: Payer: Self-pay | Admitting: Neurology

## 2016-05-19 ENCOUNTER — Telehealth: Payer: Self-pay | Admitting: Neurology

## 2016-05-19 DIAGNOSIS — F0390 Unspecified dementia without behavioral disturbance: Secondary | ICD-10-CM | POA: Insufficient documentation

## 2016-05-19 NOTE — Telephone Encounter (Signed)
PT sent Dr Delice Lesch a email through my chart and has not heard back from her and also about a referral/Dawn CB# [phone]

## 2016-05-19 NOTE — Telephone Encounter (Signed)
Routed message to provider for update.

## 2016-05-19 NOTE — Telephone Encounter (Signed)
Replied to [place]. Is she asking about the referral to Dr. Casimiro Needle? Pls f/u, thanks

## 2016-05-20 NOTE — Telephone Encounter (Signed)
Clld pt  - LMOVM giving office number to Dr. Karen Chafe office [phone] for referral update.

## 2016-05-24 ENCOUNTER — Encounter: Payer: Self-pay | Admitting: Neurology

## 2016-05-26 ENCOUNTER — Other Ambulatory Visit: Payer: Self-pay | Admitting: Neurology

## 2016-05-26 DIAGNOSIS — F321 Major depressive disorder, single episode, moderate: Secondary | ICD-10-CM

## 2016-05-26 MED ORDER — FLUOXETINE HCL 20 MG PO CAPS
ORAL_CAPSULE | ORAL | 11 refills | Status: AC
Start: 2016-05-26 — End: ?

## 2016-06-08 DIAGNOSIS — F0391 Unspecified dementia with behavioral disturbance: Secondary | ICD-10-CM | POA: Diagnosis not present

## 2016-06-08 DIAGNOSIS — M542 Cervicalgia: Secondary | ICD-10-CM | POA: Diagnosis not present

## 2016-06-08 DIAGNOSIS — W06XXXA Fall from bed, initial encounter: Secondary | ICD-10-CM | POA: Diagnosis not present

## 2016-06-09 ENCOUNTER — Telehealth: Payer: Self-pay

## 2016-06-09 DIAGNOSIS — I700 Atherosclerosis of aorta: Secondary | ICD-10-CM | POA: Diagnosis not present

## 2016-06-09 DIAGNOSIS — D72829 Elevated white blood cell count, unspecified: Secondary | ICD-10-CM | POA: Diagnosis not present

## 2016-06-09 NOTE — Telephone Encounter (Signed)
-----   Message from Cameron Sprang, MD sent at 06/08/2016  4:45 PM EDT ----- Regarding: pls call Pls call her niece Anderson Malta [phone]), she had called that the patient was suicidal and asking for change in medication. We had only increased the Prozac a month ago, if this happens again, would recommend bringing her to the ER. Also, pls see how far away Dr. Karen Chafe appt is, maybe she can be on a waitlist. Thanks

## 2016-06-09 NOTE — Telephone Encounter (Signed)
Returned niece call - advsd of provider's notation. Anderson Malta stated she understood . Anderson Malta also advsd Dr. Karen Chafe office does have the pt on a cancellation and wait list.

## 2016-06-14 DIAGNOSIS — D72829 Elevated white blood cell count, unspecified: Secondary | ICD-10-CM | POA: Diagnosis not present

## 2016-06-15 DIAGNOSIS — F0391 Unspecified dementia with behavioral disturbance: Secondary | ICD-10-CM | POA: Diagnosis not present

## 2016-06-15 DIAGNOSIS — R799 Abnormal finding of blood chemistry, unspecified: Secondary | ICD-10-CM | POA: Diagnosis not present

## 2016-07-02 DIAGNOSIS — M4712 Other spondylosis with myelopathy, cervical region: Secondary | ICD-10-CM | POA: Diagnosis not present

## 2016-07-02 DIAGNOSIS — F0391 Unspecified dementia with behavioral disturbance: Secondary | ICD-10-CM | POA: Diagnosis not present

## 2016-07-02 DIAGNOSIS — R269 Unspecified abnormalities of gait and mobility: Secondary | ICD-10-CM | POA: Diagnosis not present

## 2016-07-06 DIAGNOSIS — Z1231 Encounter for screening mammogram for malignant neoplasm of breast: Secondary | ICD-10-CM | POA: Diagnosis not present

## 2016-07-06 DIAGNOSIS — Z6824 Body mass index (BMI) 24.0-24.9, adult: Secondary | ICD-10-CM | POA: Diagnosis not present

## 2016-07-06 DIAGNOSIS — Z780 Asymptomatic menopausal state: Secondary | ICD-10-CM | POA: Diagnosis not present

## 2016-07-06 DIAGNOSIS — Z01419 Encounter for gynecological examination (general) (routine) without abnormal findings: Secondary | ICD-10-CM | POA: Diagnosis not present

## 2016-07-14 DIAGNOSIS — F0391 Unspecified dementia with behavioral disturbance: Secondary | ICD-10-CM | POA: Diagnosis not present

## 2016-07-14 DIAGNOSIS — M542 Cervicalgia: Secondary | ICD-10-CM | POA: Diagnosis not present

## 2016-07-14 DIAGNOSIS — R269 Unspecified abnormalities of gait and mobility: Secondary | ICD-10-CM | POA: Diagnosis not present

## 2016-07-14 DIAGNOSIS — M4712 Other spondylosis with myelopathy, cervical region: Secondary | ICD-10-CM | POA: Diagnosis not present

## 2016-07-14 DIAGNOSIS — M4802 Spinal stenosis, cervical region: Secondary | ICD-10-CM | POA: Diagnosis not present

## 2016-07-21 DIAGNOSIS — M4802 Spinal stenosis, cervical region: Secondary | ICD-10-CM | POA: Diagnosis not present

## 2016-07-21 DIAGNOSIS — R269 Unspecified abnormalities of gait and mobility: Secondary | ICD-10-CM | POA: Diagnosis not present

## 2016-07-21 DIAGNOSIS — G912 (Idiopathic) normal pressure hydrocephalus: Secondary | ICD-10-CM | POA: Diagnosis not present

## 2016-08-12 DIAGNOSIS — M5416 Radiculopathy, lumbar region: Secondary | ICD-10-CM | POA: Diagnosis not present

## 2016-08-25 ENCOUNTER — Ambulatory Visit (HOSPITAL_COMMUNITY): Payer: Self-pay | Admitting: Psychiatry

## 2016-08-25 ENCOUNTER — Encounter (HOSPITAL_COMMUNITY): Payer: Self-pay | Admitting: Psychiatry

## 2016-08-25 ENCOUNTER — Ambulatory Visit (INDEPENDENT_AMBULATORY_CARE_PROVIDER_SITE_OTHER): Payer: Medicare HMO | Admitting: Psychiatry

## 2016-08-25 VITALS — BP 126/72 | HR 67 | Ht 62.0 in | Wt 136.0 lb

## 2016-08-25 DIAGNOSIS — Z888 Allergy status to other drugs, medicaments and biological substances status: Secondary | ICD-10-CM | POA: Diagnosis not present

## 2016-08-25 DIAGNOSIS — F3160 Bipolar disorder, current episode mixed, unspecified: Secondary | ICD-10-CM

## 2016-08-25 DIAGNOSIS — Z87891 Personal history of nicotine dependence: Secondary | ICD-10-CM

## 2016-08-25 DIAGNOSIS — Z818 Family history of other mental and behavioral disorders: Secondary | ICD-10-CM | POA: Diagnosis not present

## 2016-08-25 DIAGNOSIS — Z79899 Other long term (current) drug therapy: Secondary | ICD-10-CM | POA: Diagnosis not present

## 2016-08-25 DIAGNOSIS — Z811 Family history of alcohol abuse and dependence: Secondary | ICD-10-CM

## 2016-08-25 DIAGNOSIS — F3162 Bipolar disorder, current episode mixed, moderate: Secondary | ICD-10-CM

## 2016-08-25 MED ORDER — DIVALPROEX SODIUM ER 250 MG PO TB24
250.0000 mg | ORAL_TABLET | Freq: Every day | ORAL | 2 refills | Status: DC
Start: 2016-08-25 — End: 2016-11-12

## 2016-08-26 NOTE — Progress Notes (Signed)
Psychiatric Initial Adult Assessment   Patient Identification: Kristine Cox MRN:  [id number] Date of Evaluation:  08/26/2016 Referral Source:  Chief Complaint:   Chief Complaint    Memory Loss     Visit Diagnosis:    ICD-10-CM   1. Bipolar I disorder, most recent episode mixed ([place]) F31.60 Valproic acid level  2. Bipolar 1 disorder, mixed, moderate ([facility]) N46.27 COMPLETE METABOLIC PANEL WITH GFR   This patient is a 76 year old white female who lives alone. She is brought here by her sister [name]. She's thought brought because the patient been irritable and impulsive. She gets angry quickly. She's had memory were she feels as if she is getting paranoid. Patient actually lives alone without any assistance other than family assistance. They do some of her institutional ADLs but she does recall her basic ADLs the patient is a widow for over 5 years. Presently she has a friend all spends time. She is to use thought she does not get along her dreaming Sr. died 2 years ago the patient does not look any more. She needs assistance. Her family maneuvers to wear hard. Patient is very tired for years. At this time the patient denies daily depression but she sleeping well. She's eating well but according to family she's not. The family is very concerned about she get agitated and angry as the day consistent with sundowning. The patient is a normal amount of energy. She denies being worthless. She's not suicidal now and never has. She denies being anxious. She likes the TV and listening to music. Patient denies the use of alcohol or drugs. She demonstrates no psychotic symptoms. She denies any episodes of major depression or mania. Patient denies generalized anxiety disorder panic or obsessive-compulsive disorder. She graduated from high school and denies any physical or sexual abuse as a child. Presently the patient takes Depakote 250 mg came from her neurologist patient takes 20 mg of Prozac. The patient has no  past psychiatric history. She's never been in a psychiatric hospital really ever seen a psychiatrist she's never been in any therapy. Essentially the family is very concerned she continues to slowly decline. She presently is under neurological evaluation for a number neurological symptoms. According to the sister who brought her she is taking something for her memory but that information is not available to [place] at this time. The patient actually takes 500 mg of Depakote but it is immediate release.  Depression Symptoms:  depressed mood, (Hypo) Manic Symptoms:  Impulsivity, Anxiety Symptoms:  Excessive Worry, Psychotic Symptoms:  Paranoia, PTSD Symptoms:   Past Psychiatric History:   Previous Psychotropic Medications: Yes   Substance Abuse History in the last 12 months:  No.  Consequences of Substance Abuse:   Past Medical History:  Past Medical History:  Diagnosis Date  . Depression   . Family history of anesthesia complication    sister had hard time waking up "  . GERD (gastroesophageal reflux disease)   . Hyperlipidemia   . Hypertension   . Mitral valve prolapse     Past Surgical History:  Procedure Laterality Date  . ABDOMINAL HYSTERECTOMY    . CHOLECYSTECTOMY    . ORIF ANKLE FRACTURE  03/21/2012  . ORIF ANKLE FRACTURE  03/21/2012   Procedure: OPEN REDUCTION INTERNAL FIXATION (ORIF) ANKLE FRACTURE;  Surgeon: Meredith Pel, MD;  Location: [place];  Service: Orthopedics;  Laterality: Left;  Open Reduction Internal Fixation left ankle fracture  . TONSILLECTOMY      Family Psychiatric  History:   Family History:  Family History  Problem Relation Age of Onset  . Melanoma Mother   . Stroke Father   . Heart disease Father   . Cancer Sister   . Alcohol abuse Sister   . Sexual abuse Sister   . Cancer Brother   . Cancer Sister   . Sexual abuse Sister   . Alcohol abuse Maternal Aunt   . Alcohol abuse Paternal Aunt   . Depression Maternal Grandfather     Social  History:   Social History   Social History  . Marital status: Widowed    Spouse name: N/A  . Number of children: N/A  . Years of education: N/A   Social History Main Topics  . Smoking status: Former Smoker    Types: Cigarettes    Quit date: 03/21/1966  . Smokeless tobacco: Never Used  . Alcohol use No  . Drug use: No  . Sexual activity: Not Currently   Other Topics Concern  . None   Social History Narrative  . None    Additional Social History:   Allergies:   Allergies  Allergen Reactions  . Succinylcholine Other (See Comments)    Unknown reaction     Metabolic Disorder Labs: No results found for: HGBA1C, MPG No results found for: PROLACTIN No results found for: CHOL, TRIG, HDL, CHOLHDL, VLDL, LDLCALC   Current Medications: Current Outpatient Prescriptions  Medication Sig Dispense Refill  . divalproex (DEPAKOTE) 250 MG DR tablet Take 250 mg by mouth 2 (two) times daily.    Marland Kitchen FLUoxetine (PROZAC) 20 MG capsule Take 2 tablets daily 60 capsule 11  . NAMZARIC 28-10 MG CP24 Take 1 capsule by mouth daily.  6  . pravastatin (PRAVACHOL) 40 MG tablet Take 40 mg by mouth daily.    . divalproex (DEPAKOTE ER) 250 MG 24 hr tablet Take 1 tablet (250 mg total) by mouth daily. 60 tablet 2  . fenofibrate (TRICOR) 145 MG tablet Take 145 mg by mouth daily.     No current facility-administered medications for this visit.     Neurologic: Headache: No Seizure: No Paresthesias:No  Musculoskeletal: Strength & Muscle Tone: within normal limits Gait & Station: normal Patient leans: Right  Psychiatric Specialty Exam: ROS  Blood pressure 126/72, pulse 67, height 5\' 2"  (1.575 m), weight 136 lb (61.7 kg).Body mass index is 24.87 kg/m.  General Appearance: Casual  Eye Contact:  Good  Speech:  Normal   Volume:  Normal  Mood:  Anxious  Affect:  Constricted  Thought Process:  Goal Directed  Orientation:  NA  Thought Content:  WDL  Suicidal Thoughts:  No  Homicidal Thoughts:   No  Memory:  Negative  Judgement:  Good  Insight:  Fair  Psychomotor Activity:  Decreased  Concentration:    Recall:  Poor  Fund of Knowledge:Poor  Language: Fair  Akathisia:  No  Handed:  Right  AIMS (if indicated):    Assets:    ADL's:  Intact  Cognition: Impaired,  Moderate  At this time     At patient is a poor historian. Her sister correction often. At this time it is evident the patient has a significant cognitive impairment. She is disoriented to month and a day. The patient is no overt evidence of psychosis. She still is ableADLs. At this time for impulsivity and irritability a dementing process. Will increase her total Depakote from taking immediate release 250 mg 2 a day to taking 250 mg extended release 2  in the morning. This will give her increased Depakote. In 3 weeks the patient will get a Depakote blood level and smaller low. She'll return to see me in 6 weeks.    Jerral Ralph, MD 6/21/20182:14 PM

## 2016-09-24 DIAGNOSIS — G912 (Idiopathic) normal pressure hydrocephalus: Secondary | ICD-10-CM | POA: Diagnosis not present

## 2016-09-24 DIAGNOSIS — R413 Other amnesia: Secondary | ICD-10-CM | POA: Diagnosis not present

## 2016-11-02 ENCOUNTER — Telehealth: Payer: Self-pay | Admitting: Neurology

## 2016-11-02 NOTE — Telephone Encounter (Signed)
PT's sister [name] left a voicemail message saying she needed to speak to Dr Delice Lesch but did not say why

## 2016-11-02 NOTE — Telephone Encounter (Signed)
Spoke with [name] who states that pt was seen by a Neurologist in [place] recently who preformed Biopsy on spinal fluid.  They were told that pt needs to have a shut placed due to fluid on the brain, which was scheduled, but the day before their appointment they were called and the appointment was canceled.  [name] will call with the name of the facility so I can obtain records.

## 2016-11-03 ENCOUNTER — Telehealth: Payer: Self-pay | Admitting: Neurology

## 2016-11-03 NOTE — Telephone Encounter (Signed)
[name] left a voicemail message asking for a call back from Dr [employer] nurse

## 2016-11-03 NOTE — Telephone Encounter (Signed)
Spoke with [name].  She states that pt was seen by Lacie Scotts, PA with [facility] [id number]) and Dr. Katherine Roan with [facility] in [place].  Will call for records

## 2016-11-12 ENCOUNTER — Ambulatory Visit (INDEPENDENT_AMBULATORY_CARE_PROVIDER_SITE_OTHER): Payer: Medicare HMO | Admitting: Psychiatry

## 2016-11-12 ENCOUNTER — Encounter (HOSPITAL_COMMUNITY): Payer: Self-pay | Admitting: Psychiatry

## 2016-11-12 ENCOUNTER — Telehealth: Payer: Self-pay | Admitting: Neurology

## 2016-11-12 VITALS — BP 152/93 | HR 61 | Ht 63.0 in | Wt 134.6 lb

## 2016-11-12 DIAGNOSIS — R4582 Worries: Secondary | ICD-10-CM

## 2016-11-12 DIAGNOSIS — F22 Delusional disorders: Secondary | ICD-10-CM | POA: Diagnosis not present

## 2016-11-12 DIAGNOSIS — Z811 Family history of alcohol abuse and dependence: Secondary | ICD-10-CM | POA: Diagnosis not present

## 2016-11-12 DIAGNOSIS — F314 Bipolar disorder, current episode depressed, severe, without psychotic features: Secondary | ICD-10-CM | POA: Diagnosis not present

## 2016-11-12 DIAGNOSIS — Z818 Family history of other mental and behavioral disorders: Secondary | ICD-10-CM

## 2016-11-12 DIAGNOSIS — Z87891 Personal history of nicotine dependence: Secondary | ICD-10-CM | POA: Diagnosis not present

## 2016-11-12 DIAGNOSIS — R4587 Impulsiveness: Secondary | ICD-10-CM

## 2016-11-12 MED ORDER — DIVALPROEX SODIUM ER 250 MG PO TB24: 250.0000 mg | ORAL_TABLET | Freq: Every day | ORAL | 3 refills | Status: DC

## 2016-11-12 MED ORDER — QUETIAPINE FUMARATE 25 MG PO TABS: 25.0000 mg | ORAL_TABLET | Freq: Two times a day (BID) | ORAL | 2 refills | Status: AC

## 2016-11-12 NOTE — Telephone Encounter (Signed)
Left message for Kristine Cox at Dr. Reola Calkins office that patient has not had an MRI since 2009 and that was ordered under a different provider and it was performed at [facility]. Asked for her to call back if she had any questions.

## 2016-11-12 NOTE — Telephone Encounter (Signed)
Kristine Cox from Dr. Nicanor Alcon office needed to see if a copy could be faxed to their office or summary from her MRI Brain yesterday. Could a summary be faxed over? Please Advise. Thanks
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
## 2016-11-17 DIAGNOSIS — F319 Bipolar disorder, unspecified: Secondary | ICD-10-CM | POA: Diagnosis not present

## 2016-11-17 DIAGNOSIS — F314 Bipolar disorder, current episode depressed, severe, without psychotic features: Secondary | ICD-10-CM | POA: Diagnosis not present

## 2016-11-17 DIAGNOSIS — Z79899 Other long term (current) drug therapy: Secondary | ICD-10-CM | POA: Diagnosis not present

## 2016-11-17 DIAGNOSIS — F0391 Unspecified dementia with behavioral disturbance: Secondary | ICD-10-CM | POA: Diagnosis not present

## 2016-11-24 DIAGNOSIS — F339 Major depressive disorder, recurrent, unspecified: Secondary | ICD-10-CM | POA: Diagnosis not present

## 2016-11-24 DIAGNOSIS — Z1379 Encounter for other screening for genetic and chromosomal anomalies: Secondary | ICD-10-CM | POA: Diagnosis not present

## 2016-11-24 DIAGNOSIS — R32 Unspecified urinary incontinence: Secondary | ICD-10-CM | POA: Diagnosis not present

## 2017-01-06 DIAGNOSIS — Z23 Encounter for immunization: Secondary | ICD-10-CM | POA: Diagnosis not present

## 2017-01-06 DIAGNOSIS — E7800 Pure hypercholesterolemia, unspecified: Secondary | ICD-10-CM | POA: Diagnosis not present

## 2017-01-06 DIAGNOSIS — D692 Other nonthrombocytopenic purpura: Secondary | ICD-10-CM | POA: Diagnosis not present

## 2017-01-06 DIAGNOSIS — R269 Unspecified abnormalities of gait and mobility: Secondary | ICD-10-CM | POA: Diagnosis not present

## 2017-01-06 DIAGNOSIS — I1310 Hypertensive heart and chronic kidney disease without heart failure, with stage 1 through stage 4 chronic kidney disease, or unspecified chronic kidney disease: Secondary | ICD-10-CM | POA: Diagnosis not present

## 2017-01-06 DIAGNOSIS — F321 Major depressive disorder, single episode, moderate: Secondary | ICD-10-CM | POA: Diagnosis not present

## 2017-01-06 DIAGNOSIS — E538 Deficiency of other specified B group vitamins: Secondary | ICD-10-CM | POA: Diagnosis not present

## 2017-01-06 DIAGNOSIS — F0390 Unspecified dementia without behavioral disturbance: Secondary | ICD-10-CM | POA: Diagnosis not present

## 2017-01-06 DIAGNOSIS — R808 Other proteinuria: Secondary | ICD-10-CM | POA: Diagnosis not present

## 2017-01-06 DIAGNOSIS — N182 Chronic kidney disease, stage 2 (mild): Secondary | ICD-10-CM | POA: Diagnosis not present

## 2017-02-11 ENCOUNTER — Ambulatory Visit (HOSPITAL_COMMUNITY): Payer: Self-pay | Admitting: Psychiatry

## 2017-04-14 DIAGNOSIS — R350 Frequency of micturition: Secondary | ICD-10-CM | POA: Diagnosis not present

## 2017-04-20 DIAGNOSIS — S99922A Unspecified injury of left foot, initial encounter: Secondary | ICD-10-CM | POA: Diagnosis not present

## 2017-05-13 DIAGNOSIS — S92512A Displaced fracture of proximal phalanx of left lesser toe(s), initial encounter for closed fracture: Secondary | ICD-10-CM | POA: Diagnosis not present

## 2017-06-19 DIAGNOSIS — R402441 Other coma, without documented Glasgow coma scale score, or with partial score reported, in the field [EMT or ambulance]: Secondary | ICD-10-CM | POA: Diagnosis not present

## 2017-06-19 DIAGNOSIS — F0390 Unspecified dementia without behavioral disturbance: Secondary | ICD-10-CM | POA: Diagnosis not present

## 2017-06-19 DIAGNOSIS — F0391 Unspecified dementia with behavioral disturbance: Secondary | ICD-10-CM | POA: Diagnosis not present

## 2017-07-05 DIAGNOSIS — F0390 Unspecified dementia without behavioral disturbance: Secondary | ICD-10-CM | POA: Diagnosis not present

## 2017-07-05 DIAGNOSIS — Z6823 Body mass index (BMI) 23.0-23.9, adult: Secondary | ICD-10-CM | POA: Diagnosis not present

## 2017-07-05 DIAGNOSIS — E538 Deficiency of other specified B group vitamins: Secondary | ICD-10-CM | POA: Diagnosis not present

## 2017-07-05 DIAGNOSIS — I10 Essential (primary) hypertension: Secondary | ICD-10-CM | POA: Diagnosis not present

## 2017-07-05 DIAGNOSIS — E7800 Pure hypercholesterolemia, unspecified: Secondary | ICD-10-CM | POA: Diagnosis not present

## 2017-07-05 DIAGNOSIS — M545 Low back pain: Secondary | ICD-10-CM | POA: Diagnosis not present

## 2017-07-05 DIAGNOSIS — N182 Chronic kidney disease, stage 2 (mild): Secondary | ICD-10-CM | POA: Diagnosis not present

## 2017-07-05 DIAGNOSIS — K219 Gastro-esophageal reflux disease without esophagitis: Secondary | ICD-10-CM | POA: Diagnosis not present

## 2017-07-05 DIAGNOSIS — J302 Other seasonal allergic rhinitis: Secondary | ICD-10-CM | POA: Diagnosis not present

## 2017-08-07 ENCOUNTER — Other Ambulatory Visit: Payer: Self-pay | Admitting: Neurology

## 2017-08-07 DIAGNOSIS — F321 Major depressive disorder, single episode, moderate: Secondary | ICD-10-CM

## 2017-08-13 DIAGNOSIS — S0990XA Unspecified injury of head, initial encounter: Secondary | ICD-10-CM | POA: Diagnosis not present

## 2017-08-13 DIAGNOSIS — Z87891 Personal history of nicotine dependence: Secondary | ICD-10-CM | POA: Diagnosis not present

## 2017-08-13 DIAGNOSIS — R531 Weakness: Secondary | ICD-10-CM | POA: Diagnosis not present

## 2017-08-13 DIAGNOSIS — R9431 Abnormal electrocardiogram [ECG] [EKG]: Secondary | ICD-10-CM | POA: Diagnosis not present

## 2017-08-13 DIAGNOSIS — R410 Disorientation, unspecified: Secondary | ICD-10-CM | POA: Diagnosis not present

## 2017-08-13 DIAGNOSIS — E785 Hyperlipidemia, unspecified: Secondary | ICD-10-CM | POA: Diagnosis not present

## 2017-08-13 DIAGNOSIS — F29 Unspecified psychosis not due to a substance or known physiological condition: Secondary | ICD-10-CM | POA: Diagnosis not present

## 2017-08-13 DIAGNOSIS — R159 Full incontinence of feces: Secondary | ICD-10-CM | POA: Diagnosis not present

## 2017-08-13 DIAGNOSIS — I10 Essential (primary) hypertension: Secondary | ICD-10-CM | POA: Diagnosis not present

## 2017-08-13 DIAGNOSIS — D696 Thrombocytopenia, unspecified: Secondary | ICD-10-CM | POA: Diagnosis not present

## 2017-08-13 DIAGNOSIS — E782 Mixed hyperlipidemia: Secondary | ICD-10-CM | POA: Diagnosis not present

## 2017-08-13 DIAGNOSIS — R918 Other nonspecific abnormal finding of lung field: Secondary | ICD-10-CM | POA: Diagnosis not present

## 2017-08-13 DIAGNOSIS — F331 Major depressive disorder, recurrent, moderate: Secondary | ICD-10-CM | POA: Diagnosis not present

## 2017-08-13 DIAGNOSIS — Z79899 Other long term (current) drug therapy: Secondary | ICD-10-CM | POA: Diagnosis not present

## 2017-08-13 DIAGNOSIS — R456 Violent behavior: Secondary | ICD-10-CM | POA: Diagnosis not present

## 2017-08-13 DIAGNOSIS — D649 Anemia, unspecified: Secondary | ICD-10-CM | POA: Diagnosis not present

## 2017-08-13 DIAGNOSIS — F0281 Dementia in other diseases classified elsewhere with behavioral disturbance: Secondary | ICD-10-CM | POA: Diagnosis not present

## 2017-08-13 DIAGNOSIS — I6781 Acute cerebrovascular insufficiency: Secondary | ICD-10-CM | POA: Diagnosis not present

## 2017-08-13 DIAGNOSIS — R7989 Other specified abnormal findings of blood chemistry: Secondary | ICD-10-CM | POA: Diagnosis not present

## 2017-08-13 DIAGNOSIS — R451 Restlessness and agitation: Secondary | ICD-10-CM | POA: Diagnosis not present

## 2017-08-13 DIAGNOSIS — R791 Abnormal coagulation profile: Secondary | ICD-10-CM | POA: Diagnosis not present

## 2017-08-13 DIAGNOSIS — F0391 Unspecified dementia with behavioral disturbance: Secondary | ICD-10-CM | POA: Diagnosis not present

## 2017-08-13 DIAGNOSIS — R0902 Hypoxemia: Secondary | ICD-10-CM | POA: Diagnosis not present

## 2017-08-13 DIAGNOSIS — K219 Gastro-esophageal reflux disease without esophagitis: Secondary | ICD-10-CM | POA: Diagnosis not present

## 2017-08-13 DIAGNOSIS — I341 Nonrheumatic mitral (valve) prolapse: Secondary | ICD-10-CM | POA: Diagnosis not present

## 2017-08-13 DIAGNOSIS — R4182 Altered mental status, unspecified: Secondary | ICD-10-CM | POA: Diagnosis not present

## 2017-08-25 DIAGNOSIS — E785 Hyperlipidemia, unspecified: Secondary | ICD-10-CM | POA: Diagnosis not present

## 2017-08-25 DIAGNOSIS — I10 Essential (primary) hypertension: Secondary | ICD-10-CM | POA: Diagnosis not present

## 2017-08-25 DIAGNOSIS — R451 Restlessness and agitation: Secondary | ICD-10-CM | POA: Diagnosis not present

## 2017-08-25 DIAGNOSIS — G309 Alzheimer's disease, unspecified: Secondary | ICD-10-CM | POA: Diagnosis not present

## 2017-08-26 DIAGNOSIS — E785 Hyperlipidemia, unspecified: Secondary | ICD-10-CM | POA: Diagnosis not present

## 2017-08-26 DIAGNOSIS — F0280 Dementia in other diseases classified elsewhere without behavioral disturbance: Secondary | ICD-10-CM | POA: Diagnosis not present

## 2017-08-26 DIAGNOSIS — G309 Alzheimer's disease, unspecified: Secondary | ICD-10-CM | POA: Diagnosis not present

## 2017-08-26 DIAGNOSIS — R451 Restlessness and agitation: Secondary | ICD-10-CM | POA: Diagnosis not present

## 2017-08-27 DIAGNOSIS — K219 Gastro-esophageal reflux disease without esophagitis: Secondary | ICD-10-CM | POA: Diagnosis not present

## 2017-08-27 DIAGNOSIS — F0391 Unspecified dementia with behavioral disturbance: Secondary | ICD-10-CM | POA: Diagnosis not present

## 2017-08-27 DIAGNOSIS — I10 Essential (primary) hypertension: Secondary | ICD-10-CM | POA: Diagnosis not present

## 2017-09-09 DIAGNOSIS — F0391 Unspecified dementia with behavioral disturbance: Secondary | ICD-10-CM | POA: Diagnosis not present

## 2017-09-09 DIAGNOSIS — M059 Rheumatoid arthritis with rheumatoid factor, unspecified: Secondary | ICD-10-CM | POA: Diagnosis not present

## 2017-09-09 DIAGNOSIS — I10 Essential (primary) hypertension: Secondary | ICD-10-CM | POA: Diagnosis not present

## 2017-09-09 DIAGNOSIS — F330 Major depressive disorder, recurrent, mild: Secondary | ICD-10-CM | POA: Diagnosis not present

## 2017-09-23 DIAGNOSIS — F330 Major depressive disorder, recurrent, mild: Secondary | ICD-10-CM | POA: Diagnosis not present

## 2017-09-23 DIAGNOSIS — F0390 Unspecified dementia without behavioral disturbance: Secondary | ICD-10-CM | POA: Diagnosis not present

## 2017-10-07 DIAGNOSIS — F330 Major depressive disorder, recurrent, mild: Secondary | ICD-10-CM | POA: Diagnosis not present

## 2017-10-07 DIAGNOSIS — F0390 Unspecified dementia without behavioral disturbance: Secondary | ICD-10-CM | POA: Diagnosis not present

## 2017-10-13 DIAGNOSIS — I10 Essential (primary) hypertension: Secondary | ICD-10-CM | POA: Diagnosis not present

## 2017-10-13 DIAGNOSIS — F330 Major depressive disorder, recurrent, mild: Secondary | ICD-10-CM | POA: Diagnosis not present

## 2017-10-13 DIAGNOSIS — F0390 Unspecified dementia without behavioral disturbance: Secondary | ICD-10-CM | POA: Diagnosis not present

## 2017-10-21 DIAGNOSIS — F330 Major depressive disorder, recurrent, mild: Secondary | ICD-10-CM | POA: Diagnosis not present

## 2017-10-21 DIAGNOSIS — F0390 Unspecified dementia without behavioral disturbance: Secondary | ICD-10-CM | POA: Diagnosis not present

## 2017-11-09 DIAGNOSIS — H40033 Anatomical narrow angle, bilateral: Secondary | ICD-10-CM | POA: Diagnosis not present

## 2017-11-09 DIAGNOSIS — H04123 Dry eye syndrome of bilateral lacrimal glands: Secondary | ICD-10-CM | POA: Diagnosis not present

## 2017-11-10 DIAGNOSIS — F329 Major depressive disorder, single episode, unspecified: Secondary | ICD-10-CM | POA: Diagnosis not present

## 2017-11-10 DIAGNOSIS — F0390 Unspecified dementia without behavioral disturbance: Secondary | ICD-10-CM | POA: Diagnosis not present

## 2017-11-18 DIAGNOSIS — F0390 Unspecified dementia without behavioral disturbance: Secondary | ICD-10-CM | POA: Diagnosis not present

## 2017-11-18 DIAGNOSIS — F330 Major depressive disorder, recurrent, mild: Secondary | ICD-10-CM | POA: Diagnosis not present

## 2017-12-15 DIAGNOSIS — Z23 Encounter for immunization: Secondary | ICD-10-CM | POA: Diagnosis not present

## 2017-12-16 DIAGNOSIS — F0390 Unspecified dementia without behavioral disturbance: Secondary | ICD-10-CM | POA: Diagnosis not present

## 2017-12-16 DIAGNOSIS — F320 Major depressive disorder, single episode, mild: Secondary | ICD-10-CM | POA: Diagnosis not present

## 2017-12-16 DIAGNOSIS — I10 Essential (primary) hypertension: Secondary | ICD-10-CM | POA: Diagnosis not present

## 2017-12-16 DIAGNOSIS — F330 Major depressive disorder, recurrent, mild: Secondary | ICD-10-CM | POA: Diagnosis not present

## 2017-12-17 DIAGNOSIS — F0391 Unspecified dementia with behavioral disturbance: Secondary | ICD-10-CM | POA: Diagnosis not present

## 2017-12-17 DIAGNOSIS — M6281 Muscle weakness (generalized): Secondary | ICD-10-CM | POA: Diagnosis not present

## 2017-12-17 DIAGNOSIS — R41841 Cognitive communication deficit: Secondary | ICD-10-CM | POA: Diagnosis not present

## 2017-12-19 DIAGNOSIS — F0391 Unspecified dementia with behavioral disturbance: Secondary | ICD-10-CM | POA: Diagnosis not present

## 2017-12-19 DIAGNOSIS — R41841 Cognitive communication deficit: Secondary | ICD-10-CM | POA: Diagnosis not present

## 2017-12-19 DIAGNOSIS — M6281 Muscle weakness (generalized): Secondary | ICD-10-CM | POA: Diagnosis not present

## 2017-12-20 DIAGNOSIS — I10 Essential (primary) hypertension: Secondary | ICD-10-CM | POA: Diagnosis not present

## 2017-12-20 DIAGNOSIS — K210 Gastro-esophageal reflux disease with esophagitis: Secondary | ICD-10-CM | POA: Diagnosis not present

## 2017-12-20 DIAGNOSIS — M6281 Muscle weakness (generalized): Secondary | ICD-10-CM | POA: Diagnosis not present

## 2017-12-20 DIAGNOSIS — R41841 Cognitive communication deficit: Secondary | ICD-10-CM | POA: Diagnosis not present

## 2017-12-20 DIAGNOSIS — F321 Major depressive disorder, single episode, moderate: Secondary | ICD-10-CM | POA: Diagnosis not present

## 2017-12-20 DIAGNOSIS — F0391 Unspecified dementia with behavioral disturbance: Secondary | ICD-10-CM | POA: Diagnosis not present

## 2017-12-20 DIAGNOSIS — G301 Alzheimer's disease with late onset: Secondary | ICD-10-CM | POA: Diagnosis not present

## 2017-12-21 DIAGNOSIS — M6281 Muscle weakness (generalized): Secondary | ICD-10-CM | POA: Diagnosis not present

## 2017-12-21 DIAGNOSIS — F0391 Unspecified dementia with behavioral disturbance: Secondary | ICD-10-CM | POA: Diagnosis not present

## 2017-12-21 DIAGNOSIS — M059 Rheumatoid arthritis with rheumatoid factor, unspecified: Secondary | ICD-10-CM | POA: Diagnosis not present

## 2017-12-21 DIAGNOSIS — R41841 Cognitive communication deficit: Secondary | ICD-10-CM | POA: Diagnosis not present

## 2017-12-21 DIAGNOSIS — M40209 Unspecified kyphosis, site unspecified: Secondary | ICD-10-CM | POA: Diagnosis not present

## 2017-12-21 DIAGNOSIS — F321 Major depressive disorder, single episode, moderate: Secondary | ICD-10-CM | POA: Diagnosis not present

## 2017-12-21 DIAGNOSIS — G301 Alzheimer's disease with late onset: Secondary | ICD-10-CM | POA: Diagnosis not present

## 2017-12-22 DIAGNOSIS — R41841 Cognitive communication deficit: Secondary | ICD-10-CM | POA: Diagnosis not present

## 2017-12-22 DIAGNOSIS — M6281 Muscle weakness (generalized): Secondary | ICD-10-CM | POA: Diagnosis not present

## 2017-12-22 DIAGNOSIS — F0391 Unspecified dementia with behavioral disturbance: Secondary | ICD-10-CM | POA: Diagnosis not present

## 2017-12-23 DIAGNOSIS — M6281 Muscle weakness (generalized): Secondary | ICD-10-CM | POA: Diagnosis not present

## 2017-12-23 DIAGNOSIS — R41841 Cognitive communication deficit: Secondary | ICD-10-CM | POA: Diagnosis not present

## 2017-12-23 DIAGNOSIS — F0391 Unspecified dementia with behavioral disturbance: Secondary | ICD-10-CM | POA: Diagnosis not present

## 2017-12-25 DIAGNOSIS — R41841 Cognitive communication deficit: Secondary | ICD-10-CM | POA: Diagnosis not present

## 2017-12-25 DIAGNOSIS — M6281 Muscle weakness (generalized): Secondary | ICD-10-CM | POA: Diagnosis not present

## 2017-12-25 DIAGNOSIS — F0391 Unspecified dementia with behavioral disturbance: Secondary | ICD-10-CM | POA: Diagnosis not present

## 2017-12-26 DIAGNOSIS — R41841 Cognitive communication deficit: Secondary | ICD-10-CM | POA: Diagnosis not present

## 2017-12-26 DIAGNOSIS — F0391 Unspecified dementia with behavioral disturbance: Secondary | ICD-10-CM | POA: Diagnosis not present

## 2017-12-26 DIAGNOSIS — M6281 Muscle weakness (generalized): Secondary | ICD-10-CM | POA: Diagnosis not present

## 2017-12-27 DIAGNOSIS — E039 Hypothyroidism, unspecified: Secondary | ICD-10-CM | POA: Diagnosis not present

## 2017-12-27 DIAGNOSIS — E119 Type 2 diabetes mellitus without complications: Secondary | ICD-10-CM | POA: Diagnosis not present

## 2017-12-27 DIAGNOSIS — E7800 Pure hypercholesterolemia, unspecified: Secondary | ICD-10-CM | POA: Diagnosis not present

## 2017-12-27 DIAGNOSIS — Z79899 Other long term (current) drug therapy: Secondary | ICD-10-CM | POA: Diagnosis not present

## 2017-12-27 DIAGNOSIS — D649 Anemia, unspecified: Secondary | ICD-10-CM | POA: Diagnosis not present

## 2017-12-27 DIAGNOSIS — E559 Vitamin D deficiency, unspecified: Secondary | ICD-10-CM | POA: Diagnosis not present

## 2017-12-27 DIAGNOSIS — E782 Mixed hyperlipidemia: Secondary | ICD-10-CM | POA: Diagnosis not present

## 2017-12-27 DIAGNOSIS — M6281 Muscle weakness (generalized): Secondary | ICD-10-CM | POA: Diagnosis not present

## 2017-12-27 DIAGNOSIS — D518 Other vitamin B12 deficiency anemias: Secondary | ICD-10-CM | POA: Diagnosis not present

## 2017-12-27 DIAGNOSIS — R41841 Cognitive communication deficit: Secondary | ICD-10-CM | POA: Diagnosis not present

## 2017-12-27 DIAGNOSIS — F0391 Unspecified dementia with behavioral disturbance: Secondary | ICD-10-CM | POA: Diagnosis not present

## 2017-12-28 DIAGNOSIS — M6281 Muscle weakness (generalized): Secondary | ICD-10-CM | POA: Diagnosis not present

## 2017-12-28 DIAGNOSIS — F0391 Unspecified dementia with behavioral disturbance: Secondary | ICD-10-CM | POA: Diagnosis not present

## 2017-12-28 DIAGNOSIS — R41841 Cognitive communication deficit: Secondary | ICD-10-CM | POA: Diagnosis not present

## 2017-12-29 DIAGNOSIS — E785 Hyperlipidemia, unspecified: Secondary | ICD-10-CM | POA: Diagnosis not present

## 2017-12-29 DIAGNOSIS — R41841 Cognitive communication deficit: Secondary | ICD-10-CM | POA: Diagnosis not present

## 2017-12-29 DIAGNOSIS — F0391 Unspecified dementia with behavioral disturbance: Secondary | ICD-10-CM | POA: Diagnosis not present

## 2017-12-29 DIAGNOSIS — M6281 Muscle weakness (generalized): Secondary | ICD-10-CM | POA: Diagnosis not present

## 2017-12-29 DIAGNOSIS — I10 Essential (primary) hypertension: Secondary | ICD-10-CM | POA: Diagnosis not present

## 2017-12-29 DIAGNOSIS — M059 Rheumatoid arthritis with rheumatoid factor, unspecified: Secondary | ICD-10-CM | POA: Diagnosis not present

## 2017-12-30 DIAGNOSIS — R41841 Cognitive communication deficit: Secondary | ICD-10-CM | POA: Diagnosis not present

## 2017-12-30 DIAGNOSIS — F0391 Unspecified dementia with behavioral disturbance: Secondary | ICD-10-CM | POA: Diagnosis not present

## 2017-12-30 DIAGNOSIS — M6281 Muscle weakness (generalized): Secondary | ICD-10-CM | POA: Diagnosis not present

## 2018-01-03 DIAGNOSIS — R41841 Cognitive communication deficit: Secondary | ICD-10-CM | POA: Diagnosis not present

## 2018-01-03 DIAGNOSIS — M6281 Muscle weakness (generalized): Secondary | ICD-10-CM | POA: Diagnosis not present

## 2018-01-03 DIAGNOSIS — F0391 Unspecified dementia with behavioral disturbance: Secondary | ICD-10-CM | POA: Diagnosis not present

## 2018-01-04 DIAGNOSIS — F0391 Unspecified dementia with behavioral disturbance: Secondary | ICD-10-CM | POA: Diagnosis not present

## 2018-01-04 DIAGNOSIS — R41841 Cognitive communication deficit: Secondary | ICD-10-CM | POA: Diagnosis not present

## 2018-01-04 DIAGNOSIS — M6281 Muscle weakness (generalized): Secondary | ICD-10-CM | POA: Diagnosis not present

## 2018-01-05 DIAGNOSIS — F0391 Unspecified dementia with behavioral disturbance: Secondary | ICD-10-CM | POA: Diagnosis not present

## 2018-01-05 DIAGNOSIS — M6281 Muscle weakness (generalized): Secondary | ICD-10-CM | POA: Diagnosis not present

## 2018-01-05 DIAGNOSIS — R41841 Cognitive communication deficit: Secondary | ICD-10-CM | POA: Diagnosis not present

## 2018-01-06 DIAGNOSIS — M6281 Muscle weakness (generalized): Secondary | ICD-10-CM | POA: Diagnosis not present

## 2018-01-06 DIAGNOSIS — F419 Anxiety disorder, unspecified: Secondary | ICD-10-CM | POA: Diagnosis not present

## 2018-01-06 DIAGNOSIS — Z72811 Adult antisocial behavior: Secondary | ICD-10-CM | POA: Diagnosis not present

## 2018-01-06 DIAGNOSIS — F321 Major depressive disorder, single episode, moderate: Secondary | ICD-10-CM | POA: Diagnosis not present

## 2018-01-06 DIAGNOSIS — F0391 Unspecified dementia with behavioral disturbance: Secondary | ICD-10-CM | POA: Diagnosis not present

## 2018-01-07 DIAGNOSIS — M6281 Muscle weakness (generalized): Secondary | ICD-10-CM | POA: Diagnosis not present

## 2018-01-07 DIAGNOSIS — F0391 Unspecified dementia with behavioral disturbance: Secondary | ICD-10-CM | POA: Diagnosis not present

## 2018-01-09 DIAGNOSIS — M6281 Muscle weakness (generalized): Secondary | ICD-10-CM | POA: Diagnosis not present

## 2018-01-09 DIAGNOSIS — F0391 Unspecified dementia with behavioral disturbance: Secondary | ICD-10-CM | POA: Diagnosis not present

## 2018-01-10 DIAGNOSIS — M6281 Muscle weakness (generalized): Secondary | ICD-10-CM | POA: Diagnosis not present

## 2018-01-10 DIAGNOSIS — F0391 Unspecified dementia with behavioral disturbance: Secondary | ICD-10-CM | POA: Diagnosis not present

## 2018-01-11 DIAGNOSIS — M6281 Muscle weakness (generalized): Secondary | ICD-10-CM | POA: Diagnosis not present

## 2018-01-11 DIAGNOSIS — F0391 Unspecified dementia with behavioral disturbance: Secondary | ICD-10-CM | POA: Diagnosis not present

## 2018-01-13 DIAGNOSIS — M6281 Muscle weakness (generalized): Secondary | ICD-10-CM | POA: Diagnosis not present

## 2018-01-13 DIAGNOSIS — F0391 Unspecified dementia with behavioral disturbance: Secondary | ICD-10-CM | POA: Diagnosis not present

## 2018-01-14 DIAGNOSIS — M6281 Muscle weakness (generalized): Secondary | ICD-10-CM | POA: Diagnosis not present

## 2018-01-14 DIAGNOSIS — F0391 Unspecified dementia with behavioral disturbance: Secondary | ICD-10-CM | POA: Diagnosis not present

## 2018-01-16 DIAGNOSIS — M6281 Muscle weakness (generalized): Secondary | ICD-10-CM | POA: Diagnosis not present

## 2018-01-16 DIAGNOSIS — F0391 Unspecified dementia with behavioral disturbance: Secondary | ICD-10-CM | POA: Diagnosis not present

## 2018-01-17 DIAGNOSIS — M6281 Muscle weakness (generalized): Secondary | ICD-10-CM | POA: Diagnosis not present

## 2018-01-17 DIAGNOSIS — F0391 Unspecified dementia with behavioral disturbance: Secondary | ICD-10-CM | POA: Diagnosis not present

## 2018-01-18 DIAGNOSIS — F0391 Unspecified dementia with behavioral disturbance: Secondary | ICD-10-CM | POA: Diagnosis not present

## 2018-01-18 DIAGNOSIS — M6281 Muscle weakness (generalized): Secondary | ICD-10-CM | POA: Diagnosis not present

## 2018-01-19 DIAGNOSIS — F0391 Unspecified dementia with behavioral disturbance: Secondary | ICD-10-CM | POA: Diagnosis not present

## 2018-01-19 DIAGNOSIS — M6281 Muscle weakness (generalized): Secondary | ICD-10-CM | POA: Diagnosis not present

## 2018-01-20 DIAGNOSIS — F0391 Unspecified dementia with behavioral disturbance: Secondary | ICD-10-CM | POA: Diagnosis not present

## 2018-01-20 DIAGNOSIS — M6281 Muscle weakness (generalized): Secondary | ICD-10-CM | POA: Diagnosis not present

## 2018-01-23 DIAGNOSIS — M6281 Muscle weakness (generalized): Secondary | ICD-10-CM | POA: Diagnosis not present

## 2018-01-23 DIAGNOSIS — F0391 Unspecified dementia with behavioral disturbance: Secondary | ICD-10-CM | POA: Diagnosis not present

## 2018-01-24 DIAGNOSIS — M6281 Muscle weakness (generalized): Secondary | ICD-10-CM | POA: Diagnosis not present

## 2018-01-24 DIAGNOSIS — F0391 Unspecified dementia with behavioral disturbance: Secondary | ICD-10-CM | POA: Diagnosis not present

## 2018-01-25 DIAGNOSIS — F0391 Unspecified dementia with behavioral disturbance: Secondary | ICD-10-CM | POA: Diagnosis not present

## 2018-01-25 DIAGNOSIS — M6281 Muscle weakness (generalized): Secondary | ICD-10-CM | POA: Diagnosis not present

## 2018-01-26 DIAGNOSIS — F0391 Unspecified dementia with behavioral disturbance: Secondary | ICD-10-CM | POA: Diagnosis not present

## 2018-01-26 DIAGNOSIS — M6281 Muscle weakness (generalized): Secondary | ICD-10-CM | POA: Diagnosis not present

## 2018-01-27 DIAGNOSIS — F0391 Unspecified dementia with behavioral disturbance: Secondary | ICD-10-CM | POA: Diagnosis not present

## 2018-01-27 DIAGNOSIS — M6281 Muscle weakness (generalized): Secondary | ICD-10-CM | POA: Diagnosis not present

## 2018-01-29 DIAGNOSIS — M6281 Muscle weakness (generalized): Secondary | ICD-10-CM | POA: Diagnosis not present

## 2018-01-29 DIAGNOSIS — F0391 Unspecified dementia with behavioral disturbance: Secondary | ICD-10-CM | POA: Diagnosis not present

## 2018-01-30 DIAGNOSIS — F0391 Unspecified dementia with behavioral disturbance: Secondary | ICD-10-CM | POA: Diagnosis not present

## 2018-01-30 DIAGNOSIS — M6281 Muscle weakness (generalized): Secondary | ICD-10-CM | POA: Diagnosis not present

## 2018-01-31 DIAGNOSIS — M6281 Muscle weakness (generalized): Secondary | ICD-10-CM | POA: Diagnosis not present

## 2018-01-31 DIAGNOSIS — F0391 Unspecified dementia with behavioral disturbance: Secondary | ICD-10-CM | POA: Diagnosis not present

## 2018-02-01 DIAGNOSIS — F0391 Unspecified dementia with behavioral disturbance: Secondary | ICD-10-CM | POA: Diagnosis not present

## 2018-02-01 DIAGNOSIS — I10 Essential (primary) hypertension: Secondary | ICD-10-CM | POA: Diagnosis not present

## 2018-02-01 DIAGNOSIS — E785 Hyperlipidemia, unspecified: Secondary | ICD-10-CM | POA: Diagnosis not present

## 2018-02-01 DIAGNOSIS — Z72811 Adult antisocial behavior: Secondary | ICD-10-CM | POA: Diagnosis not present

## 2018-02-03 DIAGNOSIS — F0391 Unspecified dementia with behavioral disturbance: Secondary | ICD-10-CM | POA: Diagnosis not present

## 2018-02-03 DIAGNOSIS — M6281 Muscle weakness (generalized): Secondary | ICD-10-CM | POA: Diagnosis not present

## 2018-02-04 DIAGNOSIS — M6281 Muscle weakness (generalized): Secondary | ICD-10-CM | POA: Diagnosis not present

## 2018-02-04 DIAGNOSIS — F0391 Unspecified dementia with behavioral disturbance: Secondary | ICD-10-CM | POA: Diagnosis not present

## 2018-02-06 DIAGNOSIS — F0391 Unspecified dementia with behavioral disturbance: Secondary | ICD-10-CM | POA: Diagnosis not present

## 2018-02-06 DIAGNOSIS — E785 Hyperlipidemia, unspecified: Secondary | ICD-10-CM | POA: Diagnosis not present

## 2018-02-06 DIAGNOSIS — M6281 Muscle weakness (generalized): Secondary | ICD-10-CM | POA: Diagnosis not present

## 2018-02-06 DIAGNOSIS — I10 Essential (primary) hypertension: Secondary | ICD-10-CM | POA: Diagnosis not present

## 2018-02-06 DIAGNOSIS — Z72811 Adult antisocial behavior: Secondary | ICD-10-CM | POA: Diagnosis not present

## 2018-02-07 DIAGNOSIS — M6281 Muscle weakness (generalized): Secondary | ICD-10-CM | POA: Diagnosis not present

## 2018-02-07 DIAGNOSIS — F0391 Unspecified dementia with behavioral disturbance: Secondary | ICD-10-CM | POA: Diagnosis not present

## 2018-02-08 DIAGNOSIS — M6281 Muscle weakness (generalized): Secondary | ICD-10-CM | POA: Diagnosis not present

## 2018-02-08 DIAGNOSIS — F0391 Unspecified dementia with behavioral disturbance: Secondary | ICD-10-CM | POA: Diagnosis not present

## 2018-02-09 DIAGNOSIS — F0391 Unspecified dementia with behavioral disturbance: Secondary | ICD-10-CM | POA: Diagnosis not present

## 2018-02-09 DIAGNOSIS — M6281 Muscle weakness (generalized): Secondary | ICD-10-CM | POA: Diagnosis not present

## 2018-03-08 DIAGNOSIS — K210 Gastro-esophageal reflux disease with esophagitis: Secondary | ICD-10-CM | POA: Diagnosis not present

## 2018-03-08 DIAGNOSIS — I10 Essential (primary) hypertension: Secondary | ICD-10-CM | POA: Diagnosis not present

## 2018-03-08 DIAGNOSIS — F0391 Unspecified dementia with behavioral disturbance: Secondary | ICD-10-CM | POA: Diagnosis not present

## 2018-03-08 DIAGNOSIS — M059 Rheumatoid arthritis with rheumatoid factor, unspecified: Secondary | ICD-10-CM | POA: Diagnosis not present

## 2018-03-19 DIAGNOSIS — M7712 Lateral epicondylitis, left elbow: Secondary | ICD-10-CM | POA: Diagnosis not present

## 2018-03-19 DIAGNOSIS — M059 Rheumatoid arthritis with rheumatoid factor, unspecified: Secondary | ICD-10-CM | POA: Diagnosis not present

## 2018-03-19 DIAGNOSIS — F0391 Unspecified dementia with behavioral disturbance: Secondary | ICD-10-CM | POA: Diagnosis not present

## 2018-04-06 DIAGNOSIS — I10 Essential (primary) hypertension: Secondary | ICD-10-CM | POA: Diagnosis not present

## 2018-04-06 DIAGNOSIS — M059 Rheumatoid arthritis with rheumatoid factor, unspecified: Secondary | ICD-10-CM | POA: Diagnosis not present

## 2018-04-06 DIAGNOSIS — F0391 Unspecified dementia with behavioral disturbance: Secondary | ICD-10-CM | POA: Diagnosis not present

## 2018-04-06 DIAGNOSIS — M40209 Unspecified kyphosis, site unspecified: Secondary | ICD-10-CM | POA: Diagnosis not present

## 2018-04-07 DIAGNOSIS — M542 Cervicalgia: Secondary | ICD-10-CM | POA: Diagnosis not present

## 2018-04-07 DIAGNOSIS — M25522 Pain in left elbow: Secondary | ICD-10-CM | POA: Diagnosis not present

## 2018-04-12 DIAGNOSIS — F0391 Unspecified dementia with behavioral disturbance: Secondary | ICD-10-CM | POA: Diagnosis not present

## 2018-04-12 DIAGNOSIS — M059 Rheumatoid arthritis with rheumatoid factor, unspecified: Secondary | ICD-10-CM | POA: Diagnosis not present

## 2018-04-12 DIAGNOSIS — S91113D Laceration without foreign body of unspecified great toe without damage to nail, subsequent encounter: Secondary | ICD-10-CM | POA: Diagnosis not present

## 2018-04-12 DIAGNOSIS — M7712 Lateral epicondylitis, left elbow: Secondary | ICD-10-CM | POA: Diagnosis not present

## 2018-04-17 DIAGNOSIS — B351 Tinea unguium: Secondary | ICD-10-CM | POA: Diagnosis not present

## 2018-04-17 DIAGNOSIS — M79675 Pain in left toe(s): Secondary | ICD-10-CM | POA: Diagnosis not present

## 2018-04-17 DIAGNOSIS — L988 Other specified disorders of the skin and subcutaneous tissue: Secondary | ICD-10-CM | POA: Diagnosis not present

## 2018-05-11 DIAGNOSIS — M6281 Muscle weakness (generalized): Secondary | ICD-10-CM | POA: Diagnosis not present

## 2018-05-11 DIAGNOSIS — F0391 Unspecified dementia with behavioral disturbance: Secondary | ICD-10-CM | POA: Diagnosis not present

## 2018-05-19 DIAGNOSIS — F0391 Unspecified dementia with behavioral disturbance: Secondary | ICD-10-CM | POA: Diagnosis not present

## 2018-05-19 DIAGNOSIS — M6281 Muscle weakness (generalized): Secondary | ICD-10-CM | POA: Diagnosis not present

## 2018-05-22 DIAGNOSIS — M6281 Muscle weakness (generalized): Secondary | ICD-10-CM | POA: Diagnosis not present

## 2018-05-22 DIAGNOSIS — F0391 Unspecified dementia with behavioral disturbance: Secondary | ICD-10-CM | POA: Diagnosis not present

## 2018-05-23 DIAGNOSIS — K210 Gastro-esophageal reflux disease with esophagitis: Secondary | ICD-10-CM | POA: Diagnosis not present

## 2018-05-23 DIAGNOSIS — I10 Essential (primary) hypertension: Secondary | ICD-10-CM | POA: Diagnosis not present

## 2018-05-23 DIAGNOSIS — M059 Rheumatoid arthritis with rheumatoid factor, unspecified: Secondary | ICD-10-CM | POA: Diagnosis not present

## 2018-05-23 DIAGNOSIS — F0391 Unspecified dementia with behavioral disturbance: Secondary | ICD-10-CM | POA: Diagnosis not present

## 2018-05-23 DIAGNOSIS — M6281 Muscle weakness (generalized): Secondary | ICD-10-CM | POA: Diagnosis not present

## 2018-05-24 DIAGNOSIS — M6281 Muscle weakness (generalized): Secondary | ICD-10-CM | POA: Diagnosis not present

## 2018-05-24 DIAGNOSIS — F0391 Unspecified dementia with behavioral disturbance: Secondary | ICD-10-CM | POA: Diagnosis not present

## 2018-05-25 DIAGNOSIS — F0391 Unspecified dementia with behavioral disturbance: Secondary | ICD-10-CM | POA: Diagnosis not present

## 2018-05-25 DIAGNOSIS — M6281 Muscle weakness (generalized): Secondary | ICD-10-CM | POA: Diagnosis not present

## 2018-05-26 DIAGNOSIS — M6281 Muscle weakness (generalized): Secondary | ICD-10-CM | POA: Diagnosis not present

## 2018-05-26 DIAGNOSIS — F0391 Unspecified dementia with behavioral disturbance: Secondary | ICD-10-CM | POA: Diagnosis not present

## 2018-05-29 DIAGNOSIS — M6281 Muscle weakness (generalized): Secondary | ICD-10-CM | POA: Diagnosis not present

## 2018-05-29 DIAGNOSIS — F0391 Unspecified dementia with behavioral disturbance: Secondary | ICD-10-CM | POA: Diagnosis not present

## 2018-05-30 DIAGNOSIS — F0391 Unspecified dementia with behavioral disturbance: Secondary | ICD-10-CM | POA: Diagnosis not present

## 2018-05-30 DIAGNOSIS — M6281 Muscle weakness (generalized): Secondary | ICD-10-CM | POA: Diagnosis not present

## 2018-05-31 DIAGNOSIS — M6281 Muscle weakness (generalized): Secondary | ICD-10-CM | POA: Diagnosis not present

## 2018-05-31 DIAGNOSIS — F0391 Unspecified dementia with behavioral disturbance: Secondary | ICD-10-CM | POA: Diagnosis not present

## 2018-06-01 DIAGNOSIS — F0391 Unspecified dementia with behavioral disturbance: Secondary | ICD-10-CM | POA: Diagnosis not present

## 2018-06-01 DIAGNOSIS — M6281 Muscle weakness (generalized): Secondary | ICD-10-CM | POA: Diagnosis not present

## 2018-06-02 DIAGNOSIS — F0391 Unspecified dementia with behavioral disturbance: Secondary | ICD-10-CM | POA: Diagnosis not present

## 2018-06-02 DIAGNOSIS — M6281 Muscle weakness (generalized): Secondary | ICD-10-CM | POA: Diagnosis not present

## 2018-06-04 DIAGNOSIS — M6281 Muscle weakness (generalized): Secondary | ICD-10-CM | POA: Diagnosis not present

## 2018-06-04 DIAGNOSIS — F0391 Unspecified dementia with behavioral disturbance: Secondary | ICD-10-CM | POA: Diagnosis not present

## 2018-06-05 DIAGNOSIS — M6281 Muscle weakness (generalized): Secondary | ICD-10-CM | POA: Diagnosis not present

## 2018-06-05 DIAGNOSIS — F0391 Unspecified dementia with behavioral disturbance: Secondary | ICD-10-CM | POA: Diagnosis not present

## 2018-06-06 DIAGNOSIS — F0391 Unspecified dementia with behavioral disturbance: Secondary | ICD-10-CM | POA: Diagnosis not present

## 2018-06-06 DIAGNOSIS — M6281 Muscle weakness (generalized): Secondary | ICD-10-CM | POA: Diagnosis not present

## 2018-06-22 ENCOUNTER — Emergency Department (HOSPITAL_COMMUNITY)
Admission: EM | Admit: 2018-06-22 | Discharge: 2018-06-23 | Disposition: A | Discharge status: Home / Self Care | Payer: Medicare Other | Attending: Emergency Medicine | Admitting: Emergency Medicine

## 2018-06-22 ENCOUNTER — Other Ambulatory Visit: Payer: Self-pay

## 2018-06-22 ENCOUNTER — Encounter (HOSPITAL_COMMUNITY): Payer: Self-pay

## 2018-06-22 DIAGNOSIS — I10 Essential (primary) hypertension: Secondary | ICD-10-CM | POA: Diagnosis not present

## 2018-06-22 DIAGNOSIS — F0390 Unspecified dementia without behavioral disturbance: Secondary | ICD-10-CM | POA: Insufficient documentation

## 2018-06-22 DIAGNOSIS — R55 Syncope and collapse: Secondary | ICD-10-CM | POA: Diagnosis not present

## 2018-06-22 DIAGNOSIS — Z79899 Other long term (current) drug therapy: Secondary | ICD-10-CM | POA: Diagnosis not present

## 2018-06-22 DIAGNOSIS — Z87891 Personal history of nicotine dependence: Secondary | ICD-10-CM | POA: Insufficient documentation

## 2018-06-22 LAB — COMPREHENSIVE METABOLIC PANEL
ALT: 14 U/L (ref 0–44)
AST: 19 U/L (ref 15–41)
Albumin: 3.3 g/dL — ABNORMAL LOW (ref 3.5–5.0)
Alkaline Phosphatase: 63 U/L (ref 38–126)
Anion gap: 9 (ref 5–15)
BUN: 14 mg/dL (ref 8–23)
CO2: 23 mmol/L (ref 22–32)
Calcium: 8.1 mg/dL — ABNORMAL LOW (ref 8.9–10.3)
Chloride: 104 mmol/L (ref 98–111)
Creatinine, Ser: 0.94 mg/dL (ref 0.44–1.00)
GFR calc Af Amer: >60 mL/min (ref >60)
GFR calc non Af Amer: 58 mL/min — ABNORMAL LOW (ref >60)
Glucose, Bld: 114 mg/dL — ABNORMAL HIGH (ref 70–99)
Potassium: 3.2 mmol/L — ABNORMAL LOW (ref 3.5–5.1)
Sodium: 136 mmol/L (ref 135–145)
Total Bilirubin: 1 mg/dL (ref 0.3–1.2)
Total Protein: 6.5 g/dL (ref 6.5–8.1)

## 2018-06-22 LAB — CBC WITH DIFFERENTIAL/PLATELET
Abs Immature Granulocytes: 0.04 10*3/uL (ref 0.00–0.07)
Basophils Absolute: 0 10*3/uL (ref 0.0–0.1)
Basophils Relative: 0 %
Eosinophils Absolute: 0.3 10*3/uL (ref 0.0–0.5)
Eosinophils Relative: 3 %
HCT: 35.5 % — ABNORMAL LOW (ref 36.0–46.0)
Hemoglobin: 11.5 g/dL — ABNORMAL LOW (ref 12.0–15.0)
Immature Granulocytes: 0 %
Lymphocytes Relative: 33 %
Lymphs Abs: 3 10*3/uL (ref 0.7–4.0)
MCH: 30.5 pg (ref 26.0–34.0)
MCHC: 32.4 g/dL (ref 30.0–36.0)
MCV: 94.2 fL (ref 80.0–100.0)
Monocytes Absolute: 0.8 10*3/uL (ref 0.1–1.0)
Monocytes Relative: 9 %
Neutro Abs: 4.8 10*3/uL (ref 1.7–7.7)
Neutrophils Relative %: 55 %
Platelets: 199 10*3/uL (ref 150–400)
RBC: 3.77 MIL/uL — ABNORMAL LOW (ref 3.87–5.11)
RDW: 13.3 % (ref 11.5–15.5)
WBC: 8.9 10*3/uL (ref 4.0–10.5)
nRBC: 0 % (ref 0.0–0.2)

## 2018-06-22 LAB — VALPROIC ACID LEVEL: Valproic Acid Lvl: <10 ug/mL — ABNORMAL LOW (ref 50.0–100.0)

## 2018-06-22 LAB — TROPONIN I: Troponin I: <0.03 ng/mL (ref <0.03)

## 2018-06-22 NOTE — Discharge Instructions (Signed)
Please make sure that Kristine Cox gets her medication this evening when she gets home including her Depakote.  She did not have any Depakote in her system  Her testing was unremarkable however if she has more syncopal episodes, more fainting spells, increasing chest pain or difficulty breathing or drops in her blood pressure she should return immediately.

## 2018-06-22 NOTE — ED Triage Notes (Signed)
Patient coming from [facility] and [facility]. EMS reports patient had syncopal episode and facility wanted to rule out CVA. Patient presented alert. Patient's family report patient is on dementia unit at [facility], and her baseline is altered.

## 2018-06-22 NOTE — ED Provider Notes (Signed)
[facility] EMERGENCY DEPARTMENT Provider Note   CSN: [id number] Arrival date & time: 06/22/18  2148    History   Chief Complaint Chief Complaint  Patient presents with  . Loss of Consciousness    HPI Kristine Cox is a 78 y.o. female.     HPI  78 year old female, history of multiple medical problems including hypertension, hyperlipidemia, dementia and lives at [facility] facility.  She presents with a complaint of syncope which occurred just prior to arrival after she had taken her evening medications.  She was witnessed to have a syncopal episode, paramedics found the patient on the floor without peripheral pulses but with central pulses, she was pale diaphoretic and bradycardic.  She states that she still feels dizzy, she does not complain of any other problems, she does not recall the events.  She does have dementia thus a level 5 caveat applies.  Paramedics give the patient approximately 750 cc of normal saline with improvement in her blood pressure but no improvement in her heart rate.  I personally reviewed the paperwork that came from the nursing facility, there was no medication list, I reviewed the paramedics report and discussed the case with the paramedics, they do not have a medication list either.  Past Medical History:  Diagnosis Date  . Depression   . Family history of anesthesia complication    sister had hard time waking up "  . GERD (gastroesophageal reflux disease)   . Hyperlipidemia   . Hypertension   . Mitral valve prolapse     Patient Active Problem List   Diagnosis Date Noted  . Mild dementia ([place]) 05/19/2016    Past Surgical History:  Procedure Laterality Date  . ABDOMINAL HYSTERECTOMY    . CHOLECYSTECTOMY    . ORIF ANKLE FRACTURE  03/21/2012  . ORIF ANKLE FRACTURE  03/21/2012   Procedure: OPEN REDUCTION INTERNAL FIXATION (ORIF) ANKLE FRACTURE;  Surgeon: Meredith Pel, MD;  Location: [facility];  Service: Orthopedics;  Laterality: Left;   Open Reduction Internal Fixation left ankle fracture  . TONSILLECTOMY       OB History   No obstetric history on file.      Home Medications    Prior to Admission medications   Medication Sig Start Date End Date Taking? Authorizing Provider  divalproex (DEPAKOTE ER) 250 MG 24 hr tablet Take 1 tablet (250 mg total) by mouth daily. 11/12/16   Plovsky, Berneta Sages, MD  divalproex (DEPAKOTE) 250 MG DR tablet Take 250 mg by mouth 2 (two) times daily. 07/02/16 07/02/17  [provider]  fenofibrate (TRICOR) 145 MG tablet Take 145 mg by mouth daily.    [provider]  FLUoxetine (PROZAC) 20 MG capsule Take 2 tablets daily 05/26/16   Cameron Sprang, MD  [facility] 28-10 MG CP24 Take 1 capsule by mouth daily. 07/11/15   [provider]  pravastatin (PRAVACHOL) 40 MG tablet Take 40 mg by mouth daily.    [provider]  QUEtiapine (SEROQUEL) 25 MG tablet Take 1 tablet (25 mg total) by mouth 2 (two) times daily. 11/12/16 11/12/17  Norma Fredrickson, MD    Family History Family History  Problem Relation Age of Onset  . Melanoma Mother   . Stroke Father   . Heart disease Father   . Cancer Sister   . Alcohol abuse Sister   . Sexual abuse Sister   . Cancer Brother   . Cancer Sister   . Sexual abuse Sister   .  Alcohol abuse Maternal Aunt   . Alcohol abuse Paternal Aunt   . Depression Maternal Grandfather     Social History Social History   Tobacco Use  . Smoking status: Former Smoker    Types: Cigarettes    Last attempt to quit: 03/21/1966    Years since quitting: 52.2  . Smokeless tobacco: Never Used  Substance Use Topics  . Alcohol use: No  . Drug use: No     Allergies   Succinylcholine   Review of Systems Review of Systems  Unable to perform ROS: Dementia     Physical Exam Updated Vital Signs BP 107/71   Pulse (!) 58   Temp (!) 97.5 F (36.4 C) (Oral)   Resp 19   Wt 63.5 kg   SpO2 93%   BMI 24.80 kg/m   Physical Exam Vitals signs and  nursing note reviewed.  Constitutional:      General: She is not in acute distress.    Appearance: She is well-developed. She is not ill-appearing, toxic-appearing or diaphoretic.     Comments: Sleepy but easily arousable  HENT:     Head: Normocephalic and atraumatic.     Mouth/Throat:     Pharynx: No oropharyngeal exudate.     Comments: Mucous membranes are normal-appearing, dentures present, no pale gingiva Eyes:     General: No scleral icterus.       Right eye: No discharge.        Left eye: No discharge.     Conjunctiva/sclera: Conjunctivae normal.     Pupils: Pupils are equal, round, and reactive to light.  Neck:     Musculoskeletal: Normal range of motion and neck supple.     Thyroid: No thyromegaly.     Vascular: No JVD.  Cardiovascular:     Rate and Rhythm: Regular rhythm. Bradycardia present.     Heart sounds: Normal heart sounds. No murmur. No friction rub. No gallop.      Comments: Heart rate of 55, weak peripheral pulses at the radial arteries but palpable, strong central arteries, no murmur Pulmonary:     Effort: Pulmonary effort is normal. No respiratory distress.     Breath sounds: Normal breath sounds. No wheezing or rales.  Abdominal:     General: Bowel sounds are normal. There is no distension.     Palpations: Abdomen is soft. There is no mass.     Tenderness: There is no abdominal tenderness.  Musculoskeletal: Normal range of motion.        General: No tenderness.     Comments: There is no edema of the lower extremities, she is able to straight leg raise bilaterally with normal strength at the hips knees and ankles bilaterally.  Lymphadenopathy:     Cervical: No cervical adenopathy.  Skin:    General: Skin is warm and dry.     Findings: No erythema or rash.     Comments: The skin is slightly pale but dry-no rash seen  Neurological:     Mental Status: She is alert.     Coordination: Coordination normal.     Comments: The patient is sleepy but awake and  easy to redirect.  She knows her name, she needs some prompting on her birthday, she does not know where she is, she is not aware of the circumstances of the evening, she is able to follow commands with all 4 extremities, there is no pronator drift, she is able to perform finger-nose-finger and has cranial nerves III through  XII which are normal.  Psychiatric:        Behavior: Behavior normal.      ED Treatments / Results  Labs (all labs ordered are listed, but only abnormal results are displayed) Labs Reviewed  VALPROIC ACID LEVEL - Abnormal; Notable for the following components:      Result Value   Valproic Acid Lvl <10 (*)    All other components within normal limits  CBC WITH DIFFERENTIAL/PLATELET - Abnormal; Notable for the following components:   RBC 3.77 (*)    Hemoglobin 11.5 (*)    HCT 35.5 (*)    All other components within normal limits  COMPREHENSIVE METABOLIC PANEL - Abnormal; Notable for the following components:   Potassium 3.2 (*)    Glucose, Bld 114 (*)    Calcium 8.1 (*)    Albumin 3.3 (*)    GFR calc non Af Amer 58 (*)    All other components within normal limits  TROPONIN I    EKG EKG Interpretation  Date/Time:  Thursday June 22 2018 21:56:12 EDT Ventricular Rate:  57 PR Interval:    QRS Duration: 95 QT Interval:  625 QTC Calculation: 609 R Axis:   45 Text Interpretation:  Sinus rhythm Borderline T abnormalities, diffuse leads Prolonged QT interval Since last tracing Rate slower Confirmed by Noemi Chapel [phone]) on 06/22/2018 10:01:26 PM   Radiology No results found.  Procedures Procedures (including critical care time)  Medications Ordered in ED Medications - No data to display   Initial Impression / Assessment and Plan / ED Course  I have reviewed the triage vital signs and the nursing notes.  Pertinent labs & imaging results that were available during my care of the patient were reviewed by me and considered in my medical decision making  (see chart for details).  Clinical Course as of Jun 21 2312  Thu Jun 22, 2018  2312 Labs are reassuring including a white blood cell count of 8900, hemoglobin of 11.5, valproic acid that was undetectable potassium of 3.2, normal renal function.  My interpretation of these results is that they are unlikely to be part of the patient's pathologic process.  EKG was unremarkable showing no signs of arrhythmia or ischemia, troponin was normal and her vital signs have been very stable with a heart rate of 58, blood pressure of 107/71 and she is afebrile and normoxic.  At this time the patient appears stable for discharge.  I do not think she needs overnight monitoring.   [BM]    Clinical Course User Index [BM] Noemi Chapel, MD      The patient is calm, redirectable and cooperative, she does not appear ill however her blood pressure is around 932 systolic and her heart rate is in the upper 50s.  Will obtain a medication list from the facility however it does not appear that she is on any daily antihypertensives based on prior records.  She is on Seroquel and Depakote, will get a Depakote level, cardiac monitoring, EKG.  Final Clinical Impressions(s) / ED Diagnoses   Final diagnoses:  Syncope and collapse    ED Discharge Orders    None       Noemi Chapel, MD 06/22/18 2314

## 2018-06-22 NOTE — ED Notes (Signed)
Pt gave verbal order to update daughter. Pt is on the phone with daughter Angela Nevin now.

## 2018-06-22 NOTE — ED Notes (Signed)
[phone] daughter Angela Nevin

## 2018-06-22 NOTE — ED Notes (Signed)
Notified miller, md of low BP

## 2018-06-23 NOTE — ED Notes (Signed)
500 ml bag of NS started by EMS has completed. IV access was started by EMS as well.

## 2018-06-23 NOTE — ED Notes (Signed)
IV that was started by EMS was d/c, catheter was intact upon removal, IV site clean, dry, and intact

## 2018-10-27 ENCOUNTER — Inpatient Hospital Stay (HOSPITAL_COMMUNITY)
Admission: EM | Admit: 2018-10-27 | Discharge: 2018-11-04 | DRG: 377 | Disposition: A | Discharge status: Skilled Nursing Facility | Payer: Medicare Other | Source: Skilled Nursing Facility | Attending: Internal Medicine | Admitting: Internal Medicine

## 2018-10-27 ENCOUNTER — Emergency Department (HOSPITAL_COMMUNITY): Payer: Medicare Other

## 2018-10-27 ENCOUNTER — Other Ambulatory Visit: Payer: Self-pay

## 2018-10-27 ENCOUNTER — Encounter (HOSPITAL_COMMUNITY): Payer: Self-pay

## 2018-10-27 DIAGNOSIS — E876 Hypokalemia: Secondary | ICD-10-CM | POA: Diagnosis present

## 2018-10-27 DIAGNOSIS — K922 Gastrointestinal hemorrhage, unspecified: Secondary | ICD-10-CM | POA: Diagnosis present

## 2018-10-27 DIAGNOSIS — Z888 Allergy status to other drugs, medicaments and biological substances status: Secondary | ICD-10-CM

## 2018-10-27 DIAGNOSIS — R471 Dysarthria and anarthria: Secondary | ICD-10-CM | POA: Diagnosis present

## 2018-10-27 DIAGNOSIS — Z8249 Family history of ischemic heart disease and other diseases of the circulatory system: Secondary | ICD-10-CM | POA: Diagnosis not present

## 2018-10-27 DIAGNOSIS — K219 Gastro-esophageal reflux disease without esophagitis: Secondary | ICD-10-CM | POA: Diagnosis present

## 2018-10-27 DIAGNOSIS — J988 Other specified respiratory disorders: Secondary | ICD-10-CM | POA: Diagnosis not present

## 2018-10-27 DIAGNOSIS — D62 Acute posthemorrhagic anemia: Secondary | ICD-10-CM | POA: Diagnosis present

## 2018-10-27 DIAGNOSIS — E785 Hyperlipidemia, unspecified: Secondary | ICD-10-CM | POA: Diagnosis present

## 2018-10-27 DIAGNOSIS — Z79899 Other long term (current) drug therapy: Secondary | ICD-10-CM | POA: Diagnosis not present

## 2018-10-27 DIAGNOSIS — Z808 Family history of malignant neoplasm of other organs or systems: Secondary | ICD-10-CM | POA: Diagnosis not present

## 2018-10-27 DIAGNOSIS — Z818 Family history of other mental and behavioral disorders: Secondary | ICD-10-CM

## 2018-10-27 DIAGNOSIS — F329 Major depressive disorder, single episode, unspecified: Secondary | ICD-10-CM | POA: Diagnosis present

## 2018-10-27 DIAGNOSIS — K920 Hematemesis: Principal | ICD-10-CM | POA: Diagnosis present

## 2018-10-27 DIAGNOSIS — Z9049 Acquired absence of other specified parts of digestive tract: Secondary | ICD-10-CM | POA: Diagnosis not present

## 2018-10-27 DIAGNOSIS — Z87891 Personal history of nicotine dependence: Secondary | ICD-10-CM

## 2018-10-27 DIAGNOSIS — U071 COVID-19: Secondary | ICD-10-CM

## 2018-10-27 DIAGNOSIS — Z90710 Acquired absence of both cervix and uterus: Secondary | ICD-10-CM | POA: Diagnosis not present

## 2018-10-27 DIAGNOSIS — I341 Nonrheumatic mitral (valve) prolapse: Secondary | ICD-10-CM | POA: Diagnosis present

## 2018-10-27 DIAGNOSIS — I10 Essential (primary) hypertension: Secondary | ICD-10-CM | POA: Diagnosis not present

## 2018-10-27 DIAGNOSIS — F0390 Unspecified dementia without behavioral disturbance: Secondary | ICD-10-CM

## 2018-10-27 DIAGNOSIS — R509 Fever, unspecified: Secondary | ICD-10-CM

## 2018-10-27 LAB — COMPREHENSIVE METABOLIC PANEL
ALT: 14 U/L (ref 0–44)
AST: 21 U/L (ref 15–41)
Albumin: 3.1 g/dL — ABNORMAL LOW (ref 3.5–5.0)
Alkaline Phosphatase: 62 U/L (ref 38–126)
Anion gap: 9 (ref 5–15)
BUN: 37 mg/dL — ABNORMAL HIGH (ref 8–23)
CO2: 25 mmol/L (ref 22–32)
Calcium: 8.9 mg/dL (ref 8.9–10.3)
Chloride: 106 mmol/L (ref 98–111)
Creatinine, Ser: 0.88 mg/dL (ref 0.44–1.00)
GFR calc Af Amer: >60 mL/min (ref >60)
GFR calc non Af Amer: >60 mL/min (ref >60)
Glucose, Bld: 151 mg/dL — ABNORMAL HIGH (ref 70–99)
Potassium: 3.7 mmol/L (ref 3.5–5.1)
Sodium: 140 mmol/L (ref 135–145)
Total Bilirubin: 0.7 mg/dL (ref 0.3–1.2)
Total Protein: 6.5 g/dL (ref 6.5–8.1)

## 2018-10-27 LAB — CBC WITH DIFFERENTIAL/PLATELET
Abs Immature Granulocytes: 0.1 10*3/uL — ABNORMAL HIGH (ref 0.00–0.07)
Basophils Absolute: 0 10*3/uL (ref 0.0–0.1)
Basophils Relative: 0 %
Eosinophils Absolute: 0 10*3/uL (ref 0.0–0.5)
Eosinophils Relative: 0 %
HCT: 33.6 % — ABNORMAL LOW (ref 36.0–46.0)
Hemoglobin: 10.5 g/dL — ABNORMAL LOW (ref 12.0–15.0)
Immature Granulocytes: 1 %
Lymphocytes Relative: 17 %
Lymphs Abs: 2.3 10*3/uL (ref 0.7–4.0)
MCH: 28.8 pg (ref 26.0–34.0)
MCHC: 31.3 g/dL (ref 30.0–36.0)
MCV: 92.3 fL (ref 80.0–100.0)
Monocytes Absolute: 0.7 10*3/uL (ref 0.1–1.0)
Monocytes Relative: 5 %
Neutro Abs: 10.5 10*3/uL — ABNORMAL HIGH (ref 1.7–7.7)
Neutrophils Relative %: 77 %
Platelets: 265 10*3/uL (ref 150–400)
RBC: 3.64 MIL/uL — ABNORMAL LOW (ref 3.87–5.11)
RDW: 13.4 % (ref 11.5–15.5)
WBC: 13.6 10*3/uL — ABNORMAL HIGH (ref 4.0–10.5)
nRBC: 0 % (ref 0.0–0.2)

## 2018-10-27 LAB — POC OCCULT BLOOD, ED: Fecal Occult Bld: POSITIVE — AB

## 2018-10-27 LAB — SARS CORONAVIRUS 2 BY RT PCR (HOSPITAL ORDER, PERFORMED IN ~~LOC~~ HOSPITAL LAB): SARS Coronavirus 2: POSITIVE — AB

## 2018-10-27 MED ORDER — ONDANSETRON HCL 4 MG PO TABS: 4.0000 mg | ORAL_TABLET | Freq: Four times a day (QID) | ORAL | Status: DC | PRN

## 2018-10-27 MED ORDER — HALOPERIDOL LACTATE 5 MG/ML IJ SOLN
2.0000 mg | Freq: Once | INTRAMUSCULAR | Status: AC
  Administered 2018-10-27: 23:00:00 2 mg via INTRAVENOUS
  Filled 2018-10-27: qty 1

## 2018-10-27 MED ORDER — PANTOPRAZOLE SODIUM 40 MG IV SOLR
40.0000 mg | Freq: Once | INTRAVENOUS | Status: AC
  Administered 2018-10-27: 40 mg via INTRAVENOUS
  Filled 2018-10-27 (×2): qty 40

## 2018-10-27 MED ORDER — SODIUM CHLORIDE 0.9 % IV SOLN
INTRAVENOUS | Status: DC
  Administered 2018-10-28 – 2018-10-29 (×4): via INTRAVENOUS

## 2018-10-27 MED ORDER — ONDANSETRON HCL 4 MG/2ML IJ SOLN: 4.0000 mg | Freq: Four times a day (QID) | INTRAMUSCULAR | Status: DC | PRN

## 2018-10-27 MED ORDER — PANTOPRAZOLE SODIUM 40 MG IV SOLR
40.0000 mg | Freq: Two times a day (BID) | INTRAVENOUS | Status: DC
  Administered 2018-10-28 – 2018-10-30 (×5): 40 mg via INTRAVENOUS
  Filled 2018-10-27 (×6): qty 40

## 2018-10-27 MED ORDER — ACETAMINOPHEN 325 MG PO TABS
650.0000 mg | ORAL_TABLET | Freq: Four times a day (QID) | ORAL | Status: DC | PRN
  Administered 2018-11-02: 13:00:00 650 mg via ORAL
  Filled 2018-10-27: qty 2

## 2018-10-27 MED ORDER — ACETAMINOPHEN 650 MG RE SUPP: 650.0000 mg | Freq: Four times a day (QID) | RECTAL | Status: DC | PRN
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
## 2018-10-27 NOTE — ED Triage Notes (Signed)
Pt lives at [facility]. Was dx with Covid on the 13th. Pt found in floor and had coughed up blood which appeared to be coffe ground. Pt is disoriented. Denies SOB or pain unsure how she fell

## 2018-10-27 NOTE — ED Notes (Signed)
Norm Parcel POA ok to speak with Daughter Adaline Sill

## 2018-10-27 NOTE — H&P (Addendum)
TRH H&P    Patient Demographics:    Kristine Cox, is a 78 y.o. female  MRN: [id number]  DOB - Oct 10, 1940  Admit Date - 10/27/2018  Referring MD/NP/PA: Dr. Thurnell Garbe  Outpatient Primary MD for the patient is Tisovec, Fransico Him, MD  Patient coming from: Skilled nursing facility  Chief complaint-vomiting blood   HPI:    Kristine Cox  is a 78 y.o. female, with history of dementia, GERD, hypertension, hyperlipidemia was brought to the ED from skilled nursing facility.  Patient is a poor historian.  History obtained from the ED notes.  As per staff at the facility patient was found on the floor.  Patient reported that she felt like she was going to pass out.  She was more dysarthric than her baseline.  Patient had emesis on the floor it was coffee-ground in appearance with large blood clots.  Patient became more alert and at baseline mental status prior to leaving the facility.  She had no previous episodes of vomiting. Patient did not have any recent fever or chills.  She was tested positive for COVID-19 7 days ago after multiple residents at facility were positive.  Patient was asymptomatic.  She was started on Lovenox on 18th and has been taking it twice a day.  Patient denies chest pain or shortness of breath. No history of fever or chills. She is not requiring oxygen. Chest x-ray in the ED is unremarkable. Denies abdominal pain.     Review of systems:    In addition to the HPI above,   No other history is obtainable as patient has underlying dementia and is poor historian.    Past History of the following :    Past Medical History:  Diagnosis Date   Depression    Family history of anesthesia complication    sister had hard time waking up "   GERD (gastroesophageal reflux disease)    Hyperlipidemia    Hypertension    Mitral valve prolapse       Past Surgical History:  Procedure  Laterality Date   ABDOMINAL HYSTERECTOMY     CHOLECYSTECTOMY     ORIF ANKLE FRACTURE  03/21/2012   ORIF ANKLE FRACTURE  03/21/2012   Procedure: OPEN REDUCTION INTERNAL FIXATION (ORIF) ANKLE FRACTURE;  Surgeon: Meredith Pel, MD;  Location: [facility];  Service: Orthopedics;  Laterality: Left;  Open Reduction Internal Fixation left ankle fracture   TONSILLECTOMY        Social History:      Social History   Tobacco Use   Smoking status: Former Smoker    Types: Cigarettes    Quit date: 03/21/1966    Years since quitting: 52.6   Smokeless tobacco: Never Used  Substance Use Topics   Alcohol use: No       Family History :     Family History  Problem Relation Age of Onset   Melanoma Mother    Stroke Father    Heart disease Father    Cancer Sister    Alcohol abuse Sister  Sexual abuse Sister    Cancer Brother    Cancer Sister    Sexual abuse Sister    Alcohol abuse Maternal Aunt    Alcohol abuse Paternal Aunt    Depression Maternal Grandfather       Home Medications:   Prior to Admission medications   Medication Sig Start Date End Date Taking? Authorizing Provider  docusate sodium (COLACE) 100 MG capsule Take 100 mg by mouth daily.   Yes [provider]  donepezil (ARICEPT) 10 MG tablet Take 10 mg by mouth at bedtime.   Yes [provider]  enoxaparin (LOVENOX) 40 MG/0.4ML injection Inject 40 mg into the skin every 12 (twelve) hours.   Yes [provider]  FLUoxetine (PROZAC) 20 MG capsule Take 2 tablets daily 05/26/16  Yes Cameron Sprang, MD  memantine (NAMENDA) 10 MG tablet Take 10 mg by mouth 2 (two) times daily.   Yes [provider]  nabumetone (RELAFEN) 750 MG tablet Take 750 mg by mouth daily.   Yes [provider]  Omega-3 Fatty Acids (FISH OIL) 1000 MG CAPS Take 1 capsule by mouth 2 (two) times daily.   Yes [provider]  pravastatin (PRAVACHOL) 40 MG tablet Take 40 mg by mouth daily.    Yes [provider]  QUEtiapine (SEROQUEL) 25 MG tablet Take 1 tablet (25 mg total) by mouth 2 (two) times daily. Patient taking differently: Take 25 mg by mouth at bedtime.  11/12/16 10/27/18 Yes Plovsky, Berneta Sages, MD  Vitamin D, Cholecalciferol, 25 MCG (1000 UT) TABS Take 2 tablets by mouth daily.   Yes [provider]  divalproex (DEPAKOTE ER) 250 MG 24 hr tablet Take 1 tablet (250 mg total) by mouth daily. Patient not taking: Reported on 10/27/2018 11/12/16   Norma Fredrickson, MD  divalproex (DEPAKOTE) 250 MG DR tablet Take 250 mg by mouth 2 (two) times daily. 07/02/16 07/02/17  [provider]     Allergies:     Allergies  Allergen Reactions   Succinylcholine Other (See Comments)    Unknown reaction      Physical Exam:   Vitals  Blood pressure 123/84, pulse 73, temperature 97.6 F (36.4 C), temperature source Oral, resp. rate 20, SpO2 95 %.  1.  General: Appears in no acute distress  2. Psychiatric: Alert, oriented to self only  3. Neurologic: Cranial nerves II through grossly intact, no focal deficit noted.  4. HEENMT:  Atraumatic normocephalic, extraocular muscles are intact  5. Respiratory : Clear to auscultation bilaterally  6. Cardiovascular : S1-S2, regular, no murmur auscultated  7. Gastrointestinal:  Abdomen is soft, nontender, no organomegaly  8. Skin:  No rashes noted.      Data Review:    CBC Recent Labs  Lab 10/27/18 1959  WBC 13.6*  HGB 10.5*  HCT 33.6*  PLT 265  MCV 92.3  MCH 28.8  MCHC 31.3  RDW 13.4  LYMPHSABS 2.3  MONOABS 0.7  EOSABS 0.0  BASOSABS 0.0   ------------------------------------------------------------------------------------------------------------------  Results for orders placed or performed during the hospital encounter of 10/27/18 (from the past 48 hour(s))  CBC with Differential     Status: Abnormal   Collection Time: 10/27/18  7:59 PM  Result Value Ref Range   WBC 13.6 (H) 4.0 -  10.5 K/uL   RBC 3.64 (L) 3.87 - 5.11 MIL/uL   Hemoglobin 10.5 (L) 12.0 - 15.0 g/dL   HCT 33.6 (L) 36.0 - 46.0 %   MCV 92.3 80.0 - 100.0  fL   MCH 28.8 26.0 - 34.0 pg   MCHC 31.3 30.0 - 36.0 g/dL   RDW 13.4 11.5 - 15.5 %   Platelets 265 150 - 400 K/uL   nRBC 0.0 0.0 - 0.2 %   Neutrophils Relative % 77 %   Neutro Abs 10.5 (H) 1.7 - 7.7 K/uL   Lymphocytes Relative 17 %   Lymphs Abs 2.3 0.7 - 4.0 K/uL   Monocytes Relative 5 %   Monocytes Absolute 0.7 0.1 - 1.0 K/uL   Eosinophils Relative 0 %   Eosinophils Absolute 0.0 0.0 - 0.5 K/uL   Basophils Relative 0 %   Basophils Absolute 0.0 0.0 - 0.1 K/uL   Immature Granulocytes 1 %   Abs Immature Granulocytes 0.10 (H) 0.00 - 0.07 K/uL    Comment: Performed at [facility], [address] [place], [place] [address]  Comprehensive metabolic panel     Status: Abnormal   Collection Time: 10/27/18  7:59 PM  Result Value Ref Range   Sodium 140 135 - 145 mmol/L   Potassium 3.7 3.5 - 5.1 mmol/L   Chloride 106 98 - 111 mmol/L   CO2 25 22 - 32 mmol/L   Glucose, Bld 151 (H) 70 - 99 mg/dL   BUN 37 (H) 8 - 23 mg/dL   Creatinine, Ser 0.88 0.44 - 1.00 mg/dL   Calcium 8.9 8.9 - 10.3 mg/dL   Total Protein 6.5 6.5 - 8.1 g/dL   Albumin 3.1 (L) 3.5 - 5.0 g/dL   AST 21 15 - 41 U/L   ALT 14 0 - 44 U/L   Alkaline Phosphatase 62 38 - 126 U/L   Total Bilirubin 0.7 0.3 - 1.2 mg/dL   GFR calc non Af Amer >60 >60 mL/min   GFR calc Af Amer >60 >60 mL/min   Anion gap 9 5 - 15    Comment: Performed at [facility], [address], [place], [place] [address]  POC occult blood, ED Provider will collect     Status: Abnormal   Collection Time: 10/27/18  8:06 PM  Result Value Ref Range   Fecal Occult Bld POSITIVE (A) NEGATIVE    Chemistries  Recent Labs  Lab 10/27/18 1959  NA 140  K 3.7  CL 106  CO2 25  GLUCOSE 151*  BUN 37*  CREATININE 0.88  CALCIUM 8.9  AST 21  ALT 14  ALKPHOS 62  BILITOT 0.7    ------------------------------------------------------------------------------------------------------------------  ------------------------------------------------------------------------------------------------------------------ GFR: CrCl cannot be calculated (Unknown ideal weight.). Liver Function Tests: Recent Labs  Lab 10/27/18 1959  AST 21  ALT 14  ALKPHOS 62  BILITOT 0.7  PROT 6.5  ALBUMIN 3.1*    --------------------------------------------------------------------------------------------------------------- Urine analysis:    Component Value Date/Time   COLORURINE YELLOW 07/18/2015 1842   APPEARANCEUR CLEAR 07/18/2015 1842   LABSPEC 1.020 07/18/2015 1842   PHURINE 6.0 07/18/2015 1842   GLUCOSEU NEGATIVE 07/18/2015 1842   HGBUR NEGATIVE 07/18/2015 1842   BILIRUBINUR NEGATIVE 07/18/2015 1842   KETONESUR NEGATIVE 07/18/2015 1842   PROTEINUR NEGATIVE 07/18/2015 1842   UROBILINOGEN 0.2 03/20/2012 1400   NITRITE NEGATIVE 07/18/2015 1842   LEUKOCYTESUR SMALL (A) 07/18/2015 1842      Imaging Results:    Ct Head Wo Contrast  Result Date: 10/27/2018 CLINICAL DATA:  Found down.  Covid positive. EXAM: CT HEAD WITHOUT CONTRAST CT CERVICAL SPINE WITHOUT CONTRAST TECHNIQUE: Multidetector CT imaging of the head and cervical spine was performed following the standard protocol without intravenous contrast. Multiplanar CT  image reconstructions of the cervical spine were also generated. COMPARISON:  Head CT 02/29/2012 FINDINGS: CT HEAD FINDINGS Brain: There is no evidence for acute hemorrhage, hydrocephalus, mass lesion, or abnormal extra-axial fluid collection. No definite CT evidence for acute infarction. Diffuse loss of parenchymal volume is consistent with atrophy. Patchy low attenuation in the deep hemispheric and periventricular white matter is nonspecific, but likely reflects chronic microvascular ischemic demyelination. Vascular: No hyperdense vessel or unexpected  calcification. Skull: No evidence for fracture. No worrisome lytic or sclerotic lesion. Sinuses/Orbits: The visualized paranasal sinuses and mastoid air cells are clear. Visualized portions of the globes and intraorbital fat are unremarkable. Other: None. CT CERVICAL SPINE FINDINGS Alignment: Normal. Skull base and vertebrae: No acute fracture. No primary bone lesion or focal pathologic process. Soft tissues and spinal canal: No prevertebral fluid or swelling. No visible canal hematoma. Disc levels: Loss of disc height with endplate degeneration is seen from C3-4 down to C6-7. Upper chest: Ground-glass opacity noted both lung apices. Other: None. IMPRESSION: 1. No acute intracranial abnormality. 2. Atrophy with chronic small vessel white matter ischemic disease. 3. Diffuse degenerative disc disease in the cervical spine without fracture. 4. Ground-glass attenuation in the lung apices, likely related to the reported history of COVID-19. Electronically Signed   By: Misty Stanley M.D.   On: 10/27/2018 21:20   Ct Cervical Spine Wo Contrast  Result Date: 10/27/2018 CLINICAL DATA:  Found down.  Covid positive. EXAM: CT HEAD WITHOUT CONTRAST CT CERVICAL SPINE WITHOUT CONTRAST TECHNIQUE: Multidetector CT imaging of the head and cervical spine was performed following the standard protocol without intravenous contrast. Multiplanar CT image reconstructions of the cervical spine were also generated. COMPARISON:  Head CT 02/29/2012 FINDINGS: CT HEAD FINDINGS Brain: There is no evidence for acute hemorrhage, hydrocephalus, mass lesion, or abnormal extra-axial fluid collection. No definite CT evidence for acute infarction. Diffuse loss of parenchymal volume is consistent with atrophy. Patchy low attenuation in the deep hemispheric and periventricular white matter is nonspecific, but likely reflects chronic microvascular ischemic demyelination. Vascular: No hyperdense vessel or unexpected calcification. Skull: No evidence for  fracture. No worrisome lytic or sclerotic lesion. Sinuses/Orbits: The visualized paranasal sinuses and mastoid air cells are clear. Visualized portions of the globes and intraorbital fat are unremarkable. Other: None. CT CERVICAL SPINE FINDINGS Alignment: Normal. Skull base and vertebrae: No acute fracture. No primary bone lesion or focal pathologic process. Soft tissues and spinal canal: No prevertebral fluid or swelling. No visible canal hematoma. Disc levels: Loss of disc height with endplate degeneration is seen from C3-4 down to C6-7. Upper chest: Ground-glass opacity noted both lung apices. Other: None. IMPRESSION: 1. No acute intracranial abnormality. 2. Atrophy with chronic small vessel white matter ischemic disease. 3. Diffuse degenerative disc disease in the cervical spine without fracture. 4. Ground-glass attenuation in the lung apices, likely related to the reported history of COVID-19. Electronically Signed   By: Misty Stanley M.D.   On: 10/27/2018 21:20   Dg Pelvis Portable  Result Date: 10/27/2018 CLINICAL DATA:  Pain status post fall EXAM: PORTABLE PELVIS 1-2 VIEWS COMPARISON:  None. FINDINGS: There is no evidence of pelvic fracture or diastasis. No pelvic bone lesions are seen. IMPRESSION: Negative. Electronically Signed   By: Constance Holster M.D.   On: 10/27/2018 19:40   Dg Chest Portable 1 View  Result Date: 10/27/2018 CLINICAL DATA:  Pain.  Cough. EXAM: PORTABLE CHEST 1 VIEW COMPARISON:  June 09, 2016 FINDINGS: There are coarse lung markings bilaterally. There is  some elevation of the right hemidiaphragm. The heart size is normal. There is no pneumothorax. There are aortic calcifications. There is no acute osseous abnormality. IMPRESSION: 1. No focal infiltrate. 2. Persistent elevation of the right hemidiaphragm. 3. Chronic appearing lung changes bilaterally which may represent emphysema. Electronically Signed   By: Constance Holster M.D.   On: 10/27/2018 19:40    My personal  review of EKG: Rhythm NSR, no ST-T changes   Assessment & Plan:    Active Problems:   GI bleed   1. GI bleed-patient presenting with upper GI bleed, she has been on NSAIDs as outpatient.  No further episodes of vomiting since patient came to the ED.  Started on Protonix 40 mg IV every 12 hours.  We will keep her n.p.o.  IV normal saline at 75 mill per hour.  Hemoglobin is stable.  Likely need upper GI endoscopy in a.m. GI consultation will be obtained.  2. Anemia-secondary to above.  Patient's hemoglobin today is 10.5, hemoglobin was 11.5 on 06/22/2018.  Follow CBC in a.m.  3. COVID-19 positive-patient had positive COVID-19 test a week ago.  She is currently asymptomatic.  Chest x-ray shows no acute abnormality.  She is not requiring oxygen.  Would continue to monitor at this time.  4. Dementia-we will hold p.o. medications at this time.  No behavior disturbance noted at this time.   DVT Prophylaxis-   SCDs    Full code  Family Communication: Admission, patients condition and plan of care including tests being ordered have been discussed with the patient  who indicate understanding and agree with the plan and Code Status.  Code Status: Full code  Admission status: Inpatient Based on patients clinical presentation and evaluation of above clinical data, I have made determination that patient meets Inpatient criteria at this time.  Time spent in minutes : 60 min   Oswald Hillock M.D on 10/27/2018 at 10:58 PM

## 2018-10-27 NOTE — ED Provider Notes (Addendum)
[facility] EMERGENCY DEPARTMENT Provider Note   CSN: [id number] Arrival date & time: 10/27/18  1851     History   Chief Complaint Chief Complaint  Patient presents with  . Hematemesis  . Fall  . covid 19    HPI Kristine Cox is a 78 y.o. female presenting for evaluation after a fall.   Level V caveat due to dementia.   Pt states she does not know what happened or why she is in the ED. She denies pain.  She denies chest pain or shortness of breath.  Additional history obtained from staff at facility.  Staff states patient was found on the floor.  Patient reported that she felt like she was going to pass out.  She was much more lethargic than her baseline.  Staff member states patient had emesis on the floor that was coffee ground in appearance with large clots.  Patient became more alert and at her baseline mental status prior to leaving the facility.  No previous episodes of emesis, GI bleed, or abnormal bowel movements.  Patient without recent fevers, chills, hypotension, tachycardia.  Patient was tested for COVID 7 days ago after multiple residents at the facility were positive.  Patient was asymptomatic, but her test was positive.  As such, she was started on Lovenox on the 18th, has been taking it twice a day since then.  She is normally ambulatory without a walker at baseline.  Staff reports no respiratory symptoms including fevers, cough, shortness of breath recently.     HPI  Past Medical History:  Diagnosis Date  . Depression   . Family history of anesthesia complication    sister had hard time waking up "  . GERD (gastroesophageal reflux disease)   . Hyperlipidemia   . Hypertension   . Mitral valve prolapse     Patient Active Problem List   Diagnosis Date Noted  . Mild dementia ([place]) 05/19/2016    Past Surgical History:  Procedure Laterality Date  . ABDOMINAL HYSTERECTOMY    . CHOLECYSTECTOMY    . ORIF ANKLE FRACTURE  03/21/2012  . ORIF ANKLE FRACTURE   03/21/2012   Procedure: OPEN REDUCTION INTERNAL FIXATION (ORIF) ANKLE FRACTURE;  Surgeon: Meredith Pel, MD;  Location: [facility];  Service: Orthopedics;  Laterality: Left;  Open Reduction Internal Fixation left ankle fracture  . TONSILLECTOMY       OB History   No obstetric history on file.      Home Medications    Prior to Admission medications   Medication Sig Start Date End Date Taking? Authorizing Provider  docusate sodium (COLACE) 100 MG capsule Take 100 mg by mouth daily.   Yes [provider]  donepezil (ARICEPT) 10 MG tablet Take 10 mg by mouth at bedtime.   Yes [provider]  enoxaparin (LOVENOX) 40 MG/0.4ML injection Inject 40 mg into the skin every 12 (twelve) hours.   Yes [provider]  FLUoxetine (PROZAC) 20 MG capsule Take 2 tablets daily 05/26/16  Yes Cameron Sprang, MD  memantine (NAMENDA) 10 MG tablet Take 10 mg by mouth 2 (two) times daily.   Yes [provider]  nabumetone (RELAFEN) 750 MG tablet Take 750 mg by mouth daily.   Yes [provider]  Omega-3 Fatty Acids (FISH OIL) 1000 MG CAPS Take 1 capsule by mouth 2 (two) times daily.   Yes [provider]  pravastatin (PRAVACHOL) 40 MG tablet Take 40 mg by mouth daily.   Yes  [provider]  QUEtiapine (SEROQUEL) 25 MG tablet Take 1 tablet (25 mg total) by mouth 2 (two) times daily. Patient taking differently: Take 25 mg by mouth at bedtime.  11/12/16 10/27/18 Yes Plovsky, Berneta Sages, MD  Vitamin D, Cholecalciferol, 25 MCG (1000 UT) TABS Take 2 tablets by mouth daily.   Yes [provider]  divalproex (DEPAKOTE ER) 250 MG 24 hr tablet Take 1 tablet (250 mg total) by mouth daily. Patient not taking: Reported on 10/27/2018 11/12/16   Norma Fredrickson, MD  divalproex (DEPAKOTE) 250 MG DR tablet Take 250 mg by mouth 2 (two) times daily. 07/02/16 07/02/17  [provider]    Family History Family History  Problem Relation Age of Onset  . Melanoma  Mother   . Stroke Father   . Heart disease Father   . Cancer Sister   . Alcohol abuse Sister   . Sexual abuse Sister   . Cancer Brother   . Cancer Sister   . Sexual abuse Sister   . Alcohol abuse Maternal Aunt   . Alcohol abuse Paternal Aunt   . Depression Maternal Grandfather     Social History Social History   Tobacco Use  . Smoking status: Former Smoker    Types: Cigarettes    Quit date: 03/21/1966    Years since quitting: 52.6  . Smokeless tobacco: Never Used  Substance Use Topics  . Alcohol use: No  . Drug use: No     Allergies   Succinylcholine   Review of Systems Review of Systems  Unable to perform ROS: Dementia  Gastrointestinal: Positive for blood in stool and vomiting.  Hematological: Bruises/bleeds easily.     Physical Exam Updated Vital Signs BP 100/67   Pulse 73   Temp 97.6 F (36.4 C) (Oral)   Resp 20   SpO2 99%   Physical Exam Vitals signs and nursing note reviewed. Exam conducted with a chaperone present.  Constitutional:      General: She is not in acute distress.    Appearance: She is well-developed.     Comments: Elderly female who appears pale, nontoxic.  HENT:     Head: Normocephalic and atraumatic.  Eyes:     Extraocular Movements: Extraocular movements intact.     Conjunctiva/sclera: Conjunctivae normal.     Pupils: Pupils are equal, round, and reactive to light.  Neck:     Musculoskeletal: Normal range of motion and neck supple.  Cardiovascular:     Rate and Rhythm: Normal rate and regular rhythm.     Pulses: Normal pulses.  Pulmonary:     Effort: Pulmonary effort is normal. No respiratory distress.     Breath sounds: Normal breath sounds. No wheezing.  Abdominal:     General: There is no distension.     Palpations: Abdomen is soft. There is no mass.     Tenderness: There is abdominal tenderness. There is no guarding or rebound.     Comments: TTP of LLQ abd  Genitourinary:    Rectum: Guaiac result positive.      Comments: Dark black stool on rectal exam.  Hemoccult positive Musculoskeletal: Normal range of motion.     Comments: TTP of L hip. No obvious contusion or deformity.  No rotation or shortening of the leg.  Skin:    General: Skin is warm and dry.     Capillary Refill: Capillary refill takes less than 2 seconds.  Neurological:     Mental Status: She is alert and oriented to  person, place, and time.      ED Treatments / Results  Labs (all labs ordered are listed, but only abnormal results are displayed) Labs Reviewed  CBC WITH DIFFERENTIAL/PLATELET - Abnormal; Notable for the following components:      Result Value   WBC 13.6 (*)    RBC 3.64 (*)    Hemoglobin 10.5 (*)    HCT 33.6 (*)    Neutro Abs 10.5 (*)    Abs Immature Granulocytes 0.10 (*)    All other components within normal limits  COMPREHENSIVE METABOLIC PANEL - Abnormal; Notable for the following components:   Glucose, Bld 151 (*)    BUN 37 (*)    Albumin 3.1 (*)    All other components within normal limits  POC OCCULT BLOOD, ED - Abnormal; Notable for the following components:   Fecal Occult Bld POSITIVE (*)    All other components within normal limits  SARS CORONAVIRUS 2 (HOSPITAL ORDER, [facility])    EKG EKG Interpretation  Date/Time:  Friday October 27 2018 18:53:44 EDT Ventricular Rate:  70 PR Interval:    QRS Duration: 115 QT Interval:  416 QTC Calculation: 449 R Axis:   49 Text Interpretation:  Sinus rhythm Nonspecific intraventricular conduction delay Borderline repolarization abnormality Baseline wander When compared with ECG of 06/22/2018 QT has shortened Otherwise no significant change Confirmed by Francine Graven [phone]) on 10/27/2018 7:43:44 PM   Radiology Ct Head Wo Contrast  Result Date: 10/27/2018 CLINICAL DATA:  Found down.  Covid positive. EXAM: CT HEAD WITHOUT CONTRAST CT CERVICAL SPINE WITHOUT CONTRAST TECHNIQUE: Multidetector CT imaging of the head and cervical  spine was performed following the standard protocol without intravenous contrast. Multiplanar CT image reconstructions of the cervical spine were also generated. COMPARISON:  Head CT 02/29/2012 FINDINGS: CT HEAD FINDINGS Brain: There is no evidence for acute hemorrhage, hydrocephalus, mass lesion, or abnormal extra-axial fluid collection. No definite CT evidence for acute infarction. Diffuse loss of parenchymal volume is consistent with atrophy. Patchy low attenuation in the deep hemispheric and periventricular white matter is nonspecific, but likely reflects chronic microvascular ischemic demyelination. Vascular: No hyperdense vessel or unexpected calcification. Skull: No evidence for fracture. No worrisome lytic or sclerotic lesion. Sinuses/Orbits: The visualized paranasal sinuses and mastoid air cells are clear. Visualized portions of the globes and intraorbital fat are unremarkable. Other: None. CT CERVICAL SPINE FINDINGS Alignment: Normal. Skull base and vertebrae: No acute fracture. No primary bone lesion or focal pathologic process. Soft tissues and spinal canal: No prevertebral fluid or swelling. No visible canal hematoma. Disc levels: Loss of disc height with endplate degeneration is seen from C3-4 down to C6-7. Upper chest: Ground-glass opacity noted both lung apices. Other: None. IMPRESSION: 1. No acute intracranial abnormality. 2. Atrophy with chronic small vessel white matter ischemic disease. 3. Diffuse degenerative disc disease in the cervical spine without fracture. 4. Ground-glass attenuation in the lung apices, likely related to the reported history of COVID-19. Electronically Signed   By: Misty Stanley M.D.   On: 10/27/2018 21:20   Ct Cervical Spine Wo Contrast  Result Date: 10/27/2018 CLINICAL DATA:  Found down.  Covid positive. EXAM: CT HEAD WITHOUT CONTRAST CT CERVICAL SPINE WITHOUT CONTRAST TECHNIQUE: Multidetector CT imaging of the head and cervical spine was performed following the  standard protocol without intravenous contrast. Multiplanar CT image reconstructions of the cervical spine were also generated. COMPARISON:  Head CT 02/29/2012 FINDINGS: CT HEAD FINDINGS Brain: There is no evidence for  acute hemorrhage, hydrocephalus, mass lesion, or abnormal extra-axial fluid collection. No definite CT evidence for acute infarction. Diffuse loss of parenchymal volume is consistent with atrophy. Patchy low attenuation in the deep hemispheric and periventricular white matter is nonspecific, but likely reflects chronic microvascular ischemic demyelination. Vascular: No hyperdense vessel or unexpected calcification. Skull: No evidence for fracture. No worrisome lytic or sclerotic lesion. Sinuses/Orbits: The visualized paranasal sinuses and mastoid air cells are clear. Visualized portions of the globes and intraorbital fat are unremarkable. Other: None. CT CERVICAL SPINE FINDINGS Alignment: Normal. Skull base and vertebrae: No acute fracture. No primary bone lesion or focal pathologic process. Soft tissues and spinal canal: No prevertebral fluid or swelling. No visible canal hematoma. Disc levels: Loss of disc height with endplate degeneration is seen from C3-4 down to C6-7. Upper chest: Ground-glass opacity noted both lung apices. Other: None. IMPRESSION: 1. No acute intracranial abnormality. 2. Atrophy with chronic small vessel white matter ischemic disease. 3. Diffuse degenerative disc disease in the cervical spine without fracture. 4. Ground-glass attenuation in the lung apices, likely related to the reported history of COVID-19. Electronically Signed   By: Misty Stanley M.D.   On: 10/27/2018 21:20   Dg Pelvis Portable  Result Date: 10/27/2018 CLINICAL DATA:  Pain status post fall EXAM: PORTABLE PELVIS 1-2 VIEWS COMPARISON:  None. FINDINGS: There is no evidence of pelvic fracture or diastasis. No pelvic bone lesions are seen. IMPRESSION: Negative. Electronically Signed   By: Constance Holster  M.D.   On: 10/27/2018 19:40   Dg Chest Portable 1 View  Result Date: 10/27/2018 CLINICAL DATA:  Pain.  Cough. EXAM: PORTABLE CHEST 1 VIEW COMPARISON:  June 09, 2016 FINDINGS: There are coarse lung markings bilaterally. There is some elevation of the right hemidiaphragm. The heart size is normal. There is no pneumothorax. There are aortic calcifications. There is no acute osseous abnormality. IMPRESSION: 1. No focal infiltrate. 2. Persistent elevation of the right hemidiaphragm. 3. Chronic appearing lung changes bilaterally which may represent emphysema. Electronically Signed   By: Constance Holster M.D.   On: 10/27/2018 19:40    Procedures Procedures (including critical care time)  Medications Ordered in ED Medications  pantoprazole (PROTONIX) injection 40 mg (has no administration in time range)     Initial Impression / Assessment and Plan / ED Course  I have reviewed the triage vital signs and the nursing notes.  Pertinent labs & imaging results that were available during my care of the patient were reviewed by me and considered in my medical decision making (see chart for details).        Patient presenting for evaluation of hematemesis.  Physical exam shows patient who appears pale, but nontoxic.  Heart rate and blood pressure stable, although blood pressure is soft.  Dark stool on rectal exam, Hemoccult positive.  Patient was recently started on Lovenox, concern for GI bleed considering history and exam.  Obtain labs for further evaluation.  CT head and neck, as patient is unable to tell us if she hit her head when she fell.  Wrist x-ray for further evaluation, pelvic x-ray due to left hip pain.  X-rays viewed interpreted by me, no fracture, dislocation, bony.  Labs show mild leukocytosis at 13.6.  Hemoglobin stable at 10.5, slightly down from patient's baseline at around 11.  BUN elevated at 37 consistent wtih bleed. Creatinine stable.  CT head and neck negative for bleed,  fracture, or injury.  Will call for admission. Protonix started.   Discussed with  Dr. Darrick Meigs from [facility], who will admit the pt. Requesting repeat covid test to see which hospital pt should go to. requesting consult with [facility].   Discussed with Dr. Collene Mares from [facility].  Depending on if patient goes to [facility], or [facility], patient will need different GI consultation.  As such, requests hospitalist make consult once it is known which hospital patient will be transferred to. Discussed with Dr. Darrick Meigs, who is aware   Final Clinical Impressions(s) / ED Diagnoses   Final diagnoses:  Gastrointestinal hemorrhage, unspecified gastrointestinal hemorrhage type  COVID-19    ED Discharge Orders    None       Franchot Heidelberg, PA-C 10/27/18 [id number]    Franchot Heidelberg, PA-C 10/27/18 [place], Ke[place], DO 11/02/18 [phone]

## 2018-10-28 DIAGNOSIS — U071 COVID-19: Secondary | ICD-10-CM

## 2018-10-28 DIAGNOSIS — D62 Acute posthemorrhagic anemia: Secondary | ICD-10-CM

## 2018-10-28 DIAGNOSIS — F0390 Unspecified dementia without behavioral disturbance: Secondary | ICD-10-CM

## 2018-10-28 LAB — CBC WITH DIFFERENTIAL/PLATELET
Abs Immature Granulocytes: 0.09 10*3/uL — ABNORMAL HIGH (ref 0.00–0.07)
Basophils Absolute: 0 10*3/uL (ref 0.0–0.1)
Basophils Relative: 0 %
Eosinophils Absolute: 0 10*3/uL (ref 0.0–0.5)
Eosinophils Relative: 0 %
HCT: 28.3 % — ABNORMAL LOW (ref 36.0–46.0)
Hemoglobin: 9.1 g/dL — ABNORMAL LOW (ref 12.0–15.0)
Immature Granulocytes: 1 %
Lymphocytes Relative: 24 %
Lymphs Abs: 2.6 10*3/uL (ref 0.7–4.0)
MCH: 29.5 pg (ref 26.0–34.0)
MCHC: 32.2 g/dL (ref 30.0–36.0)
MCV: 91.9 fL (ref 80.0–100.0)
Monocytes Absolute: 0.7 10*3/uL (ref 0.1–1.0)
Monocytes Relative: 7 %
Neutro Abs: 7.6 10*3/uL (ref 1.7–7.7)
Neutrophils Relative %: 68 %
Platelets: 254 10*3/uL (ref 150–400)
RBC: 3.08 MIL/uL — ABNORMAL LOW (ref 3.87–5.11)
RDW: 13.8 % (ref 11.5–15.5)
WBC: 11.1 10*3/uL — ABNORMAL HIGH (ref 4.0–10.5)
nRBC: 0 % (ref 0.0–0.2)

## 2018-10-28 LAB — RENAL FUNCTION PANEL
Albumin: 2.9 g/dL — ABNORMAL LOW (ref 3.5–5.0)
Anion gap: 10 (ref 5–15)
BUN: 43 mg/dL — ABNORMAL HIGH (ref 8–23)
CO2: 22 mmol/L (ref 22–32)
Calcium: 8.7 mg/dL — ABNORMAL LOW (ref 8.9–10.3)
Chloride: 110 mmol/L (ref 98–111)
Creatinine, Ser: 0.75 mg/dL (ref 0.44–1.00)
GFR calc Af Amer: >60 mL/min (ref >60)
GFR calc non Af Amer: >60 mL/min (ref >60)
Glucose, Bld: 109 mg/dL — ABNORMAL HIGH (ref 70–99)
Phosphorus: 2.9 mg/dL (ref 2.5–4.6)
Potassium: 3.5 mmol/L (ref 3.5–5.1)
Sodium: 142 mmol/L (ref 135–145)

## 2018-10-28 LAB — C-REACTIVE PROTEIN: CRP: <0.8 mg/dL (ref <1.0)

## 2018-10-28 LAB — FERRITIN: Ferritin: 160 ng/mL (ref 11–307)

## 2018-10-28 LAB — MAGNESIUM: Magnesium: 1.8 mg/dL (ref 1.7–2.4)

## 2018-10-28 LAB — D-DIMER, QUANTITATIVE: D-Dimer, Quant: 0.72 ug/mL-FEU — ABNORMAL HIGH (ref 0.00–0.50)

## 2018-10-28 NOTE — ED Provider Notes (Signed)
I spoke to Dr., Michail Sermon.  Gi on call at [facility].  He will consult on pt in am.   Fransico Meadow, PA-C 10/28/18 [place], [place], DO 11/02/18 [phone]

## 2018-10-28 NOTE — Progress Notes (Signed)
Daughter Angela Nevin called to get an update on pt. Daughter given update per request. Stacey Drain

## 2018-10-28 NOTE — Consult Note (Signed)
Referring Provider: Dr. Marylyn Ishihara Primary Care Physician:  Osborne Casco Fransico Him, MD Primary Gastroenterologist:  UNASSIGNED  Reason for Consultation:  GI bleed  HPI: Kristine Cox is a 78 y.o. female with dementia transferred from [facility] last night after presenting to their ER from her SNF where she was found down with coffee-grounds emesis and large blood clots on the floor. In ER her Hgb 10.5 and is 9.1 here. She is demented and unable to provide any history per chart review. She reportedly tested POSITIVE for COVID one week ago at her skilled nursing facility and was asymptomatic. She had been started on Lovenox BID on 10/24/18 and was on NSAIDs. Since admit here she had one black loose stool and no report of nausea/vomiting. Hemodynamics have been stable. Due to positive COVID status with her dementia and inability to obtain any additional history from the patient review of chart and discussion with nurse and hospitalist was completed without examining patient.   Past Medical History:  Diagnosis Date  . Depression   . Family history of anesthesia complication    sister had hard time waking up "  . GERD (gastroesophageal reflux disease)   . Hyperlipidemia   . Hypertension   . Mitral valve prolapse     Past Surgical History:  Procedure Laterality Date  . ABDOMINAL HYSTERECTOMY    . CHOLECYSTECTOMY    . ORIF ANKLE FRACTURE  03/21/2012  . ORIF ANKLE FRACTURE  03/21/2012   Procedure: OPEN REDUCTION INTERNAL FIXATION (ORIF) ANKLE FRACTURE;  Surgeon: Meredith Pel, MD;  Location: [facility];  Service: Orthopedics;  Laterality: Left;  Open Reduction Internal Fixation left ankle fracture  . TONSILLECTOMY      Prior to Admission medications   Medication Sig Start Date End Date Taking? Authorizing Provider  docusate sodium (COLACE) 100 MG capsule Take 100 mg by mouth daily.   Yes [provider]  donepezil (ARICEPT) 10 MG tablet Take 10 mg by mouth at bedtime.   Yes [provider]  enoxaparin (LOVENOX) 40 MG/0.4ML injection Inject 40 mg into the skin every 12 (twelve) hours.   Yes [provider]  FLUoxetine (PROZAC) 20 MG capsule Take 2 tablets daily 05/26/16  Yes Cameron Sprang, MD  memantine (NAMENDA) 10 MG tablet Take 10 mg by mouth 2 (two) times daily.   Yes [provider]  nabumetone (RELAFEN) 750 MG tablet Take 750 mg by mouth daily.   Yes [provider]  Omega-3 Fatty Acids (FISH OIL) 1000 MG CAPS Take 1 capsule by mouth 2 (two) times daily.   Yes [provider]  pravastatin (PRAVACHOL) 40 MG tablet Take 40 mg by mouth daily.   Yes [provider]  QUEtiapine (SEROQUEL) 25 MG tablet Take 1 tablet (25 mg total) by mouth 2 (two) times daily. Patient taking differently: Take 25 mg by mouth at bedtime.  11/12/16 10/27/18 Yes Plovsky, Berneta Sages, MD  Vitamin D, Cholecalciferol, 25 MCG (1000 UT) TABS Take 2 tablets by mouth daily.   Yes [provider]  divalproex (DEPAKOTE ER) 250 MG 24 hr tablet Take 1 tablet (250 mg total) by mouth daily. Patient not taking: Reported on 10/27/2018 11/12/16   Norma Fredrickson, MD  divalproex (DEPAKOTE) 250 MG DR tablet Take 250 mg by mouth 2 (two) times daily. 07/02/16 07/02/17  [provider]    Scheduled Meds: . pantoprazole (PROTONIX) IV  40 mg Intravenous Q12H   Continuous Infusions: . sodium chloride 75 mL/hr at 10/28/18 1132  PRN Meds:.acetaminophen **OR** acetaminophen, ondansetron **OR** ondansetron (ZOFRAN) IV  Allergies as of 10/27/2018 - Review Complete 10/27/2018  Allergen Reaction Noted  . Succinylcholine Other (See Comments) 03/20/2012    Family History  Problem Relation Age of Onset  . Melanoma Mother   . Stroke Father   . Heart disease Father   . Cancer Sister   . Alcohol abuse Sister   . Sexual abuse Sister   . Cancer Brother   . Cancer Sister   . Sexual abuse Sister   . Alcohol abuse Maternal Aunt   . Alcohol abuse Paternal Aunt    . Depression Maternal Grandfather     Social History   Socioeconomic History  . Marital status: Widowed    Spouse name: Not on file  . Number of children: Not on file  . Years of education: Not on file  . Highest education level: Not on file  Occupational History  . Not on file  Social Needs  . Financial resource strain: Not on file  . Food insecurity    Worry: Not on file    Inability: Not on file  . Transportation needs    Medical: Not on file    Non-medical: Not on file  Tobacco Use  . Smoking status: Former Smoker    Types: Cigarettes    Quit date: 03/21/1966    Years since quitting: 52.6  . Smokeless tobacco: Never Used  Substance and Sexual Activity  . Alcohol use: No  . Drug use: No  . Sexual activity: Not Currently  Lifestyle  . Physical activity    Days per week: Not on file    Minutes per session: Not on file  . Stress: Not on file  Relationships  . Social Herbalist on phone: Not on file    Gets together: Not on file    Attends religious service: Not on file    Active member of club or organization: Not on file    Attends meetings of clubs or organizations: Not on file    Relationship status: Not on file  . Intimate partner violence    Fear of current or ex partner: Not on file    Emotionally abused: Not on file    Physically abused: Not on file    Forced sexual activity: Not on file  Other Topics Concern  . Not on file  Social History Narrative  . Not on file    Review of Systems: All negative except as stated above in HPI.  Physical Exam: Vital signs: Vitals:   10/28/18 0430 10/28/18 0544  BP: (!) 99/52 124/90  Pulse: 78 85  Resp: 18 18  Temp:  98.4 F (36.9 C)  SpO2: 98% 98%   Last BM Date: 10/27/18 Patient not examined  GI:  Lab Results: Recent Labs    10/27/18 1959 10/28/18 0842  WBC 13.6* 11.1*  HGB 10.5* 9.1*  HCT 33.6* 28.3*  PLT 265 254   BMET Recent Labs    10/27/18 1959 10/28/18 0842  NA 140 142   K 3.7 3.5  CL 106 110  CO2 25 22  GLUCOSE 151* 109*  BUN 37* 43*  CREATININE 0.88 0.75  CALCIUM 8.9 8.7*   LFT Recent Labs    10/27/18 1959 10/28/18 0842  PROT 6.5  --   ALBUMIN 3.1* 2.9*  AST 21  --   ALT 14  --   ALKPHOS 62  --   BILITOT 0.7  --  PT/INR No results for input(s): LABPROT, INR in the last 72 hours.   Studies/Results: Ct Head Wo Contrast  Result Date: 10/27/2018 CLINICAL DATA:  Found down.  Covid positive. EXAM: CT HEAD WITHOUT CONTRAST CT CERVICAL SPINE WITHOUT CONTRAST TECHNIQUE: Multidetector CT imaging of the head and cervical spine was performed following the standard protocol without intravenous contrast. Multiplanar CT image reconstructions of the cervical spine were also generated. COMPARISON:  Head CT 02/29/2012 FINDINGS: CT HEAD FINDINGS Brain: There is no evidence for acute hemorrhage, hydrocephalus, mass lesion, or abnormal extra-axial fluid collection. No definite CT evidence for acute infarction. Diffuse loss of parenchymal volume is consistent with atrophy. Patchy low attenuation in the deep hemispheric and periventricular white matter is nonspecific, but likely reflects chronic microvascular ischemic demyelination. Vascular: No hyperdense vessel or unexpected calcification. Skull: No evidence for fracture. No worrisome lytic or sclerotic lesion. Sinuses/Orbits: The visualized paranasal sinuses and mastoid air cells are clear. Visualized portions of the globes and intraorbital fat are unremarkable. Other: None. CT CERVICAL SPINE FINDINGS Alignment: Normal. Skull base and vertebrae: No acute fracture. No primary bone lesion or focal pathologic process. Soft tissues and spinal canal: No prevertebral fluid or swelling. No visible canal hematoma. Disc levels: Loss of disc height with endplate degeneration is seen from C3-4 down to C6-7. Upper chest: Ground-glass opacity noted both lung apices. Other: None. IMPRESSION: 1. No acute intracranial abnormality. 2.  Atrophy with chronic small vessel white matter ischemic disease. 3. Diffuse degenerative disc disease in the cervical spine without fracture. 4. Ground-glass attenuation in the lung apices, likely related to the reported history of COVID-19. Electronically Signed   By: Misty Stanley M.D.   On: 10/27/2018 21:20   Ct Cervical Spine Wo Contrast  Result Date: 10/27/2018 CLINICAL DATA:  Found down.  Covid positive. EXAM: CT HEAD WITHOUT CONTRAST CT CERVICAL SPINE WITHOUT CONTRAST TECHNIQUE: Multidetector CT imaging of the head and cervical spine was performed following the standard protocol without intravenous contrast. Multiplanar CT image reconstructions of the cervical spine were also generated. COMPARISON:  Head CT 02/29/2012 FINDINGS: CT HEAD FINDINGS Brain: There is no evidence for acute hemorrhage, hydrocephalus, mass lesion, or abnormal extra-axial fluid collection. No definite CT evidence for acute infarction. Diffuse loss of parenchymal volume is consistent with atrophy. Patchy low attenuation in the deep hemispheric and periventricular white matter is nonspecific, but likely reflects chronic microvascular ischemic demyelination. Vascular: No hyperdense vessel or unexpected calcification. Skull: No evidence for fracture. No worrisome lytic or sclerotic lesion. Sinuses/Orbits: The visualized paranasal sinuses and mastoid air cells are clear. Visualized portions of the globes and intraorbital fat are unremarkable. Other: None. CT CERVICAL SPINE FINDINGS Alignment: Normal. Skull base and vertebrae: No acute fracture. No primary bone lesion or focal pathologic process. Soft tissues and spinal canal: No prevertebral fluid or swelling. No visible canal hematoma. Disc levels: Loss of disc height with endplate degeneration is seen from C3-4 down to C6-7. Upper chest: Ground-glass opacity noted both lung apices. Other: None. IMPRESSION: 1. No acute intracranial abnormality. 2. Atrophy with chronic small vessel white  matter ischemic disease. 3. Diffuse degenerative disc disease in the cervical spine without fracture. 4. Ground-glass attenuation in the lung apices, likely related to the reported history of COVID-19. Electronically Signed   By: Misty Stanley M.D.   On: 10/27/2018 21:20   Dg Pelvis Portable  Result Date: 10/27/2018 CLINICAL DATA:  Pain status post fall EXAM: PORTABLE PELVIS 1-2 VIEWS COMPARISON:  None. FINDINGS: There is no evidence of  pelvic fracture or diastasis. No pelvic bone lesions are seen. IMPRESSION: Negative. Electronically Signed   By: Constance Holster M.D.   On: 10/27/2018 19:40   Dg Chest Portable 1 View  Result Date: 10/27/2018 CLINICAL DATA:  Pain.  Cough. EXAM: PORTABLE CHEST 1 VIEW COMPARISON:  June 09, 2016 FINDINGS: There are coarse lung markings bilaterally. There is some elevation of the right hemidiaphragm. The heart size is normal. There is no pneumothorax. There are aortic calcifications. There is no acute osseous abnormality. IMPRESSION: 1. No focal infiltrate. 2. Persistent elevation of the right hemidiaphragm. 3. Chronic appearing lung changes bilaterally which may represent emphysema. Electronically Signed   By: Constance Holster M.D.   On: 10/27/2018 19:40    Impression/Plan: GI bleed in the setting of recent Lovenox and NSAID use with POSITIVE COVID-19 status. Patient is demented and unable to obtain any additional history from review of the chart and discussion with staff. Mildly hypotensive that has normalized. Hemodynamically stable here. Would manage conservatively due to dementia and positive Covid-19 status with IV PPI Q 12 hours. Change to Protonix drip if melena persists. NPO. Avoid anticoagulation. Hold off on EGD unless becomes hemodynamically unstable. Discussed with Dr. Marylyn Ishihara.    LOS: 1 day   Lear Ng  10/28/2018, 12:22 PM  Questions please call [phone]

## 2018-10-28 NOTE — Progress Notes (Signed)
[facility]  PROGRESS NOTE    Kristine Cox  [id number] DOB: 10-12-1940 DOA: 10/27/2018 PCP: Haywood Pao, MD   Brief Narrative:   Kristine Cox  is a 78 y.o. female, with history of dementia, GERD, hypertension, hyperlipidemia was brought to the ED from skilled nursing facility.  Patient is a poor historian.  History obtained from the ED notes.  As per staff at the facility patient was found on the floor.  Patient reported that she felt like she was going to pass out.  She was more dysarthric than her baseline.  Patient had emesis on the floor it was coffee-ground in appearance with large blood clots.  Patient became more alert and at baseline mental status prior to leaving the facility.  She had no previous episodes of vomiting. Patient did not have any recent fever or chills.  She was tested positive for COVID-19 7 days ago after multiple residents at facility were positive.  Patient was asymptomatic. She was started on Lovenox on 18th and has been taking it twice a day.   Assessment & Plan:   Active Problems:   Dementia without behavioral disturbance (HCC)   GI bleed   Acute blood loss anemia   COVID-19 virus infection   GI bleed     - patient presenting with upper GI bleed (coffee ground emesis at facility)      - she has been on NSAIDs as outpatient; was also on twice daily lovenox for COVID coag precautions      - No further episodes of vomiting since patient came to the ED.     - Protonix 40 mg IV q12h; n.p.o; NS 75cc/hr.       - Hgb is 9.1; monitor.     - GI consulted by EDP; to see this AM  Acute blood loss anemia     - At admission, Hgb was 10.5; hemoglobin was 11.5 on 06/22/2018.     - Hgb 9.1 this AM  COVID-19 positive     - patient had positive COVID-19 test a week ago.     - She is currently asymptomatic.       - Chest x-ray shows no acute abnormality.       - She is not requiring oxygen; monitor  Dementia     - NPO at this time.     - No behavior disturbance  noted at this time.  DVT prophylaxis: SCDs Code Status: FULL   Disposition Plan: TBD  Consultants:   GI   ROS:  Denies CP, dyspnea, N, V, D, ab pain. Remainder 10-pt ROS is negative for all not previously mentioned.  Subjective: "I guess I'm at [place]."  Objective: Vitals:   10/28/18 0330 10/28/18 0400 10/28/18 0430 10/28/18 0544  BP: (!) 97/55 98/65 (!) 99/52 124/90  Pulse:   78 85  Resp: 20 20 18 18   Temp:    98.4 F (36.9 C)  TempSrc:    Oral  SpO2:   98% 98%  Weight:    58.8 kg  Height:    5\' 3"  (1.6 m)    Intake/Output Summary (Last 24 hours) at 10/28/2018 1021 Last data filed at 10/28/2018 [id number] Gross per 24 hour  Intake 249.46 ml  Output 500 ml  Net -250.54 ml   Filed Weights   10/28/18 0544  Weight: 58.8 kg    Examination:  General: 78 y.o. female resting in bed in NAD Eyes: PERRL, normal sclera ENMT: Nares patent w/o discharge, orophaynx clear,  dentition normal, ears w/o discharge/lesions/ulcers Cardiovascular: RRR, +S1, S2, no m/g/r, equal pulses throughout Respiratory: CTABL, no w/r/r, normal WOB GI: BS+, NDNT, no masses noted, no organomegaly noted MSK: No e/c/c Neuro: alert to name, follows commands, no focal deficits   Data Reviewed: I have personally reviewed following labs and imaging studies.  CBC: Recent Labs  Lab 10/27/18 1959 10/28/18 0842  WBC 13.6* 11.1*  NEUTROABS 10.5* 7.6  HGB 10.5* 9.1*  HCT 33.6* 28.3*  MCV 92.3 91.9  PLT 265 0000000   Basic Metabolic Panel: Recent Labs  Lab 10/27/18 1959 10/28/18 0842  NA 140 142  K 3.7 3.5  CL 106 110  CO2 25 22  GLUCOSE 151* 109*  BUN 37* 43*  CREATININE 0.88 0.75  CALCIUM 8.9 8.7*  MG  --  1.8  PHOS  --  2.9   GFR: Estimated Creatinine Clearance: 47.9 mL/min (by C-G formula based on SCr of 0.75 mg/dL). Liver Function Tests: Recent Labs  Lab 10/27/18 1959 10/28/18 0842  AST 21  --   ALT 14  --   ALKPHOS 62  --   BILITOT 0.7  --   PROT 6.5  --   ALBUMIN 3.1* 2.9*    No results for input(s): LIPASE, AMYLASE in the last 168 hours. No results for input(s): AMMONIA in the last 168 hours. Coagulation Profile: No results for input(s): INR, PROTIME in the last 168 hours. Cardiac Enzymes: No results for input(s): CKTOTAL, CKMB, CKMBINDEX, TROPONINI in the last 168 hours. BNP (last 3 results) No results for input(s): PROBNP in the last 8760 hours. HbA1C: No results for input(s): HGBA1C in the last 72 hours. CBG: No results for input(s): GLUCAP in the last 168 hours. Lipid Profile: No results for input(s): CHOL, HDL, LDLCALC, TRIG, CHOLHDL, LDLDIRECT in the last 72 hours. Thyroid Function Tests: No results for input(s): TSH, T4TOTAL, FREET4, T3FREE, THYROIDAB in the last 72 hours. Anemia Panel: No results for input(s): VITAMINB12, FOLATE, FERRITIN, TIBC, IRON, RETICCTPCT in the last 72 hours. Sepsis Labs: No results for input(s): PROCALCITON, LATICACIDVEN in the last 168 hours.  Recent Results (from the past 240 hour(s))  SARS Coronavirus 2 [facility] order, Performed in [facility] hospital lab) Nasopharyngeal Nasopharyngeal Swab     Status: Abnormal   Collection Time: 10/27/18  9:37 PM   Specimen: Nasopharyngeal Swab  Result Value Ref Range Status   SARS Coronavirus 2 POSITIVE (A) NEGATIVE Final    Comment: RESULT CALLED TO, READ BACK BY AND VERIFIED WITH: WALKER,T @ 2340 ON 10/27/18 BY JUW Performed at [facility], [address], [place], [place] [address]       Radiology Studies: Ct Head Wo Contrast  Result Date: 10/27/2018 CLINICAL DATA:  Found down.  Covid positive. EXAM: CT HEAD WITHOUT CONTRAST CT CERVICAL SPINE WITHOUT CONTRAST TECHNIQUE: Multidetector CT imaging of the head and cervical spine was performed following the standard protocol without intravenous contrast. Multiplanar CT image reconstructions of the cervical spine were also generated. COMPARISON:  Head CT 02/29/2012 FINDINGS: CT HEAD FINDINGS Brain: There is no evidence for  acute hemorrhage, hydrocephalus, mass lesion, or abnormal extra-axial fluid collection. No definite CT evidence for acute infarction. Diffuse loss of parenchymal volume is consistent with atrophy. Patchy low attenuation in the deep hemispheric and periventricular white matter is nonspecific, but likely reflects chronic microvascular ischemic demyelination. Vascular: No hyperdense vessel or unexpected calcification. Skull: No evidence for fracture. No worrisome lytic or sclerotic lesion. Sinuses/Orbits: The visualized paranasal sinuses and mastoid air cells  are clear. Visualized portions of the globes and intraorbital fat are unremarkable. Other: None. CT CERVICAL SPINE FINDINGS Alignment: Normal. Skull base and vertebrae: No acute fracture. No primary bone lesion or focal pathologic process. Soft tissues and spinal canal: No prevertebral fluid or swelling. No visible canal hematoma. Disc levels: Loss of disc height with endplate degeneration is seen from C3-4 down to C6-7. Upper chest: Ground-glass opacity noted both lung apices. Other: None. IMPRESSION: 1. No acute intracranial abnormality. 2. Atrophy with chronic small vessel white matter ischemic disease. 3. Diffuse degenerative disc disease in the cervical spine without fracture. 4. Ground-glass attenuation in the lung apices, likely related to the reported history of COVID-19. Electronically Signed   By: Misty Stanley M.D.   On: 10/27/2018 21:20   Ct Cervical Spine Wo Contrast  Result Date: 10/27/2018 CLINICAL DATA:  Found down.  Covid positive. EXAM: CT HEAD WITHOUT CONTRAST CT CERVICAL SPINE WITHOUT CONTRAST TECHNIQUE: Multidetector CT imaging of the head and cervical spine was performed following the standard protocol without intravenous contrast. Multiplanar CT image reconstructions of the cervical spine were also generated. COMPARISON:  Head CT 02/29/2012 FINDINGS: CT HEAD FINDINGS Brain: There is no evidence for acute hemorrhage, hydrocephalus, mass  lesion, or abnormal extra-axial fluid collection. No definite CT evidence for acute infarction. Diffuse loss of parenchymal volume is consistent with atrophy. Patchy low attenuation in the deep hemispheric and periventricular white matter is nonspecific, but likely reflects chronic microvascular ischemic demyelination. Vascular: No hyperdense vessel or unexpected calcification. Skull: No evidence for fracture. No worrisome lytic or sclerotic lesion. Sinuses/Orbits: The visualized paranasal sinuses and mastoid air cells are clear. Visualized portions of the globes and intraorbital fat are unremarkable. Other: None. CT CERVICAL SPINE FINDINGS Alignment: Normal. Skull base and vertebrae: No acute fracture. No primary bone lesion or focal pathologic process. Soft tissues and spinal canal: No prevertebral fluid or swelling. No visible canal hematoma. Disc levels: Loss of disc height with endplate degeneration is seen from C3-4 down to C6-7. Upper chest: Ground-glass opacity noted both lung apices. Other: None. IMPRESSION: 1. No acute intracranial abnormality. 2. Atrophy with chronic small vessel white matter ischemic disease. 3. Diffuse degenerative disc disease in the cervical spine without fracture. 4. Ground-glass attenuation in the lung apices, likely related to the reported history of COVID-19. Electronically Signed   By: Misty Stanley M.D.   On: 10/27/2018 21:20   Dg Pelvis Portable  Result Date: 10/27/2018 CLINICAL DATA:  Pain status post fall EXAM: PORTABLE PELVIS 1-2 VIEWS COMPARISON:  None. FINDINGS: There is no evidence of pelvic fracture or diastasis. No pelvic bone lesions are seen. IMPRESSION: Negative. Electronically Signed   By: Constance Holster M.D.   On: 10/27/2018 19:40   Dg Chest Portable 1 View  Result Date: 10/27/2018 CLINICAL DATA:  Pain.  Cough. EXAM: PORTABLE CHEST 1 VIEW COMPARISON:  June 09, 2016 FINDINGS: There are coarse lung markings bilaterally. There is some elevation of the  right hemidiaphragm. The heart size is normal. There is no pneumothorax. There are aortic calcifications. There is no acute osseous abnormality. IMPRESSION: 1. No focal infiltrate. 2. Persistent elevation of the right hemidiaphragm. 3. Chronic appearing lung changes bilaterally which may represent emphysema. Electronically Signed   By: Constance Holster M.D.   On: 10/27/2018 19:40     Scheduled Meds: . pantoprazole (PROTONIX) IV  40 mg Intravenous Q12H   Continuous Infusions: . sodium chloride 75 mL/hr at 10/28/18 0544     LOS: 1 day  Time spent: 25 minutes spent in the coordination of care today.   Jonnie Finner, DO [facility] Pager [phone]  If 7PM-7AM, please contact night-coverage [email or web address] Password [facility] 10/28/2018, 10:21 AM

## 2018-10-29 DIAGNOSIS — E876 Hypokalemia: Secondary | ICD-10-CM

## 2018-10-29 LAB — CBC WITH DIFFERENTIAL/PLATELET
Abs Immature Granulocytes: 0.07 10*3/uL (ref 0.00–0.07)
Basophils Absolute: 0 10*3/uL (ref 0.0–0.1)
Basophils Relative: 0 %
Eosinophils Absolute: 0.1 10*3/uL (ref 0.0–0.5)
Eosinophils Relative: 1 %
HCT: 24.9 % — ABNORMAL LOW (ref 36.0–46.0)
Hemoglobin: 8.1 g/dL — ABNORMAL LOW (ref 12.0–15.0)
Immature Granulocytes: 1 %
Lymphocytes Relative: 28 %
Lymphs Abs: 3 10*3/uL (ref 0.7–4.0)
MCH: 30.2 pg (ref 26.0–34.0)
MCHC: 32.5 g/dL (ref 30.0–36.0)
MCV: 92.9 fL (ref 80.0–100.0)
Monocytes Absolute: 0.7 10*3/uL (ref 0.1–1.0)
Monocytes Relative: 7 %
Neutro Abs: 6.6 10*3/uL (ref 1.7–7.7)
Neutrophils Relative %: 63 %
Platelets: 246 10*3/uL (ref 150–400)
RBC: 2.68 MIL/uL — ABNORMAL LOW (ref 3.87–5.11)
RDW: 14.1 % (ref 11.5–15.5)
WBC: 10.5 10*3/uL (ref 4.0–10.5)
nRBC: 0 % (ref 0.0–0.2)

## 2018-10-29 LAB — MAGNESIUM: Magnesium: 1.8 mg/dL (ref 1.7–2.4)

## 2018-10-29 LAB — RENAL FUNCTION PANEL
Albumin: 2.8 g/dL — ABNORMAL LOW (ref 3.5–5.0)
Anion gap: 9 (ref 5–15)
BUN: 21 mg/dL (ref 8–23)
CO2: 21 mmol/L — ABNORMAL LOW (ref 22–32)
Calcium: 8.6 mg/dL — ABNORMAL LOW (ref 8.9–10.3)
Chloride: 116 mmol/L — ABNORMAL HIGH (ref 98–111)
Creatinine, Ser: 0.7 mg/dL (ref 0.44–1.00)
GFR calc Af Amer: >60 mL/min (ref >60)
GFR calc non Af Amer: >60 mL/min (ref >60)
Glucose, Bld: 97 mg/dL (ref 70–99)
Phosphorus: 2.5 mg/dL (ref 2.5–4.6)
Potassium: 2.9 mmol/L — ABNORMAL LOW (ref 3.5–5.1)
Sodium: 146 mmol/L — ABNORMAL HIGH (ref 135–145)

## 2018-10-29 LAB — D-DIMER, QUANTITATIVE: D-Dimer, Quant: 0.74 ug/mL-FEU — ABNORMAL HIGH (ref 0.00–0.50)

## 2018-10-29 LAB — FERRITIN: Ferritin: 158 ng/mL (ref 11–307)

## 2018-10-29 LAB — C-REACTIVE PROTEIN: CRP: <0.8 mg/dL (ref <1.0)

## 2018-10-29 MED ORDER — POTASSIUM CHLORIDE CRYS ER 20 MEQ PO TBCR
40.0000 meq | EXTENDED_RELEASE_TABLET | Freq: Two times a day (BID) | ORAL | Status: DC
  Administered 2018-10-29 – 2018-11-04 (×13): 40 meq via ORAL
  Filled 2018-10-29 (×13): qty 2

## 2018-10-29 NOTE — Progress Notes (Signed)
Marland Kitchen  PROGRESS NOTE    Kristine Cox  [id number] DOB: 04-05-1940 DOA: 10/27/2018 PCP: Haywood Pao, MD   Brief Narrative:   Kristine Cox [id number] y.o.female,with history of dementia, GERD, hypertension, hyperlipidemia was brought to the ED from skilled nursing facility. Patient is a poor historian. History obtained from the ED notes. As per staff at the facility patient was found on the floor. Patient reported that she felt like she was going to pass out. She was more dysarthric than her baseline. Patient had emesis on the floor it was coffee-ground in appearance with large blood clots. Patient became more alert and at baseline mental status prior to leaving the facility. She had no previous episodes of vomiting. Patient did not have any recent fever or chills. She was tested positive for COVID-19 7 days ago after multiple residents at facility were positive. Patient was asymptomatic. She was started on Lovenox on 18th and has been taking it twice a day.   Assessment & Plan:   Active Problems:   Dementia without behavioral disturbance (HCC)   GI bleed   Acute blood loss anemia   COVID-19 virus infection   Hypokalemia   GI bleed     - patient presenting with upper GI bleed (coffee ground emesis at facility)      - she has been on NSAIDs as outpatient; was also on twice daily lovenox for COVID coag precautions      - No further episodes of vomiting since patient came to the ED.     - Protonix 40 mg IV q12h; n.p.o; NS 75cc/hr.       - Hgb is 9.1; monitor.     - GI onboard; conservative management     - Hgb down to 8.1 this AM; transfuse for less than 7.  Acute blood loss anemia     - At admission, Hgb was 10.5; hemoglobin was 11.5 on 06/22/2018.     - Hgb 8.1 this AM     - see above  COVID-19 positive     - patient had positive COVID-19 test a week ago.     - She is currently asymptomatic.       - Chest x-ray shows no acute abnormality.       - She is not  requiring oxygen; monitor  Dementia     - NPO at this time.     - No behavior disturbance noted at this time.  Hypokalemia     - replete, monitor  Hgb still dropping. Denies dark stools, emesis. Transfuse for Hgb <7. Updated POA [name]) by phone.  DVT prophylaxis: SCDs Code Status: FULL   Disposition Plan: TBD  Consultants:   GI   ROS:  Denies CP, dyspnea, N, V, dark stools. Remainder 10-pt ROS is negative for all not previously mentioned.  Subjective: "I guess that's ok."  Objective: Vitals:   10/28/18 0544 10/28/18 1350 10/28/18 2231 10/29/18 0623  BP: 124/90 127/75 120/65 (!) 121/59  Pulse: 85 79 75 77  Resp: 18 17 20 20   Temp: 98.4 F (36.9 C) 98.7 F (37.1 C) 98.2 F (36.8 C) 97.8 F (36.6 C)  TempSrc: Oral Oral Oral Oral  SpO2: 98% 99%  96%  Weight: 58.8 kg     Height: 5\' 3"  (1.6 m)       Intake/Output Summary (Last 24 hours) at 10/29/2018 0736 Last data filed at 10/29/2018 0549 Gross per 24 hour  Intake 1936.15 ml  Output -  Net 1936.15 ml  Filed Weights   10/28/18 0544  Weight: 58.8 kg    Examination:  General: 78 y.o. female resting in bed in NAD Eyes: PERRL, normal sclera ENMT: Nares patent w/o discharge, orophaynx clear, dentition normal, ears w/o discharge/lesions/ulcers Cardiovascular: RRR, +S1, S2, no m/g/r, equal pulses throughout Respiratory: CTABL, no w/r/r, normal WOB GI: BS+, NDNT, no masses noted, no organomegaly noted MSK: No e/c/c Neuro: alert to name, follows commands, no focal deficits   Data Reviewed: I have personally reviewed following labs and imaging studies.  CBC: Recent Labs  Lab 10/27/18 1959 10/28/18 0842 10/29/18 0535  WBC 13.6* 11.1* 10.5  NEUTROABS 10.5* 7.6 6.6  HGB 10.5* 9.1* 8.1*  HCT 33.6* 28.3* 24.9*  MCV 92.3 91.9 92.9  PLT 265 254 0000000   Basic Metabolic Panel: Recent Labs  Lab 10/27/18 1959 10/28/18 0842  NA 140 142  K 3.7 3.5  CL 106 110  CO2 25 22  GLUCOSE 151* 109*  BUN 37* 43*   CREATININE 0.88 0.75  CALCIUM 8.9 8.7*  MG  --  1.8  PHOS  --  2.9   GFR: Estimated Creatinine Clearance: 47.9 mL/min (by C-G formula based on SCr of 0.75 mg/dL). Liver Function Tests: Recent Labs  Lab 10/27/18 1959 10/28/18 0842  AST 21  --   ALT 14  --   ALKPHOS 62  --   BILITOT 0.7  --   PROT 6.5  --   ALBUMIN 3.1* 2.9*   No results for input(s): LIPASE, AMYLASE in the last 168 hours. No results for input(s): AMMONIA in the last 168 hours. Coagulation Profile: No results for input(s): INR, PROTIME in the last 168 hours. Cardiac Enzymes: No results for input(s): CKTOTAL, CKMB, CKMBINDEX, TROPONINI in the last 168 hours. BNP (last 3 results) No results for input(s): PROBNP in the last 8760 hours. HbA1C: No results for input(s): HGBA1C in the last 72 hours. CBG: No results for input(s): GLUCAP in the last 168 hours. Lipid Profile: No results for input(s): CHOL, HDL, LDLCALC, TRIG, CHOLHDL, LDLDIRECT in the last 72 hours. Thyroid Function Tests: No results for input(s): TSH, T4TOTAL, FREET4, T3FREE, THYROIDAB in the last 72 hours. Anemia Panel: Recent Labs    10/28/18 0842  FERRITIN 160   Sepsis Labs: No results for input(s): PROCALCITON, LATICACIDVEN in the last 168 hours.  Recent Results (from the past 240 hour(s))  SARS Coronavirus 2 [facility] order, Performed in [facility] hospital lab) Nasopharyngeal Nasopharyngeal Swab     Status: Abnormal   Collection Time: 10/27/18  9:37 PM   Specimen: Nasopharyngeal Swab  Result Value Ref Range Status   SARS Coronavirus 2 POSITIVE (A) NEGATIVE Final    Comment: RESULT CALLED TO, READ BACK BY AND VERIFIED WITH: WALKER,T @ 2340 ON 10/27/18 BY JUW Performed at [facility], [address], [place], [place] [address]       Radiology Studies: Ct Head Wo Contrast  Result Date: 10/27/2018 CLINICAL DATA:  Found down.  Covid positive. EXAM: CT HEAD WITHOUT CONTRAST CT CERVICAL SPINE WITHOUT CONTRAST TECHNIQUE: Multidetector  CT imaging of the head and cervical spine was performed following the standard protocol without intravenous contrast. Multiplanar CT image reconstructions of the cervical spine were also generated. COMPARISON:  Head CT 02/29/2012 FINDINGS: CT HEAD FINDINGS Brain: There is no evidence for acute hemorrhage, hydrocephalus, mass lesion, or abnormal extra-axial fluid collection. No definite CT evidence for acute infarction. Diffuse loss of parenchymal volume is consistent with atrophy. Patchy low attenuation in the deep hemispheric  and periventricular white matter is nonspecific, but likely reflects chronic microvascular ischemic demyelination. Vascular: No hyperdense vessel or unexpected calcification. Skull: No evidence for fracture. No worrisome lytic or sclerotic lesion. Sinuses/Orbits: The visualized paranasal sinuses and mastoid air cells are clear. Visualized portions of the globes and intraorbital fat are unremarkable. Other: None. CT CERVICAL SPINE FINDINGS Alignment: Normal. Skull base and vertebrae: No acute fracture. No primary bone lesion or focal pathologic process. Soft tissues and spinal canal: No prevertebral fluid or swelling. No visible canal hematoma. Disc levels: Loss of disc height with endplate degeneration is seen from C3-4 down to C6-7. Upper chest: Ground-glass opacity noted both lung apices. Other: None. IMPRESSION: 1. No acute intracranial abnormality. 2. Atrophy with chronic small vessel white matter ischemic disease. 3. Diffuse degenerative disc disease in the cervical spine without fracture. 4. Ground-glass attenuation in the lung apices, likely related to the reported history of COVID-19. Electronically Signed   By: Misty Stanley M.D.   On: 10/27/2018 21:20   Ct Cervical Spine Wo Contrast  Result Date: 10/27/2018 CLINICAL DATA:  Found down.  Covid positive. EXAM: CT HEAD WITHOUT CONTRAST CT CERVICAL SPINE WITHOUT CONTRAST TECHNIQUE: Multidetector CT imaging of the head and cervical  spine was performed following the standard protocol without intravenous contrast. Multiplanar CT image reconstructions of the cervical spine were also generated. COMPARISON:  Head CT 02/29/2012 FINDINGS: CT HEAD FINDINGS Brain: There is no evidence for acute hemorrhage, hydrocephalus, mass lesion, or abnormal extra-axial fluid collection. No definite CT evidence for acute infarction. Diffuse loss of parenchymal volume is consistent with atrophy. Patchy low attenuation in the deep hemispheric and periventricular white matter is nonspecific, but likely reflects chronic microvascular ischemic demyelination. Vascular: No hyperdense vessel or unexpected calcification. Skull: No evidence for fracture. No worrisome lytic or sclerotic lesion. Sinuses/Orbits: The visualized paranasal sinuses and mastoid air cells are clear. Visualized portions of the globes and intraorbital fat are unremarkable. Other: None. CT CERVICAL SPINE FINDINGS Alignment: Normal. Skull base and vertebrae: No acute fracture. No primary bone lesion or focal pathologic process. Soft tissues and spinal canal: No prevertebral fluid or swelling. No visible canal hematoma. Disc levels: Loss of disc height with endplate degeneration is seen from C3-4 down to C6-7. Upper chest: Ground-glass opacity noted both lung apices. Other: None. IMPRESSION: 1. No acute intracranial abnormality. 2. Atrophy with chronic small vessel white matter ischemic disease. 3. Diffuse degenerative disc disease in the cervical spine without fracture. 4. Ground-glass attenuation in the lung apices, likely related to the reported history of COVID-19. Electronically Signed   By: Misty Stanley M.D.   On: 10/27/2018 21:20   Dg Pelvis Portable  Result Date: 10/27/2018 CLINICAL DATA:  Pain status post fall EXAM: PORTABLE PELVIS 1-2 VIEWS COMPARISON:  None. FINDINGS: There is no evidence of pelvic fracture or diastasis. No pelvic bone lesions are seen. IMPRESSION: Negative. Electronically  Signed   By: Constance Holster M.D.   On: 10/27/2018 19:40   Dg Chest Portable 1 View  Result Date: 10/27/2018 CLINICAL DATA:  Pain.  Cough. EXAM: PORTABLE CHEST 1 VIEW COMPARISON:  June 09, 2016 FINDINGS: There are coarse lung markings bilaterally. There is some elevation of the right hemidiaphragm. The heart size is normal. There is no pneumothorax. There are aortic calcifications. There is no acute osseous abnormality. IMPRESSION: 1. No focal infiltrate. 2. Persistent elevation of the right hemidiaphragm. 3. Chronic appearing lung changes bilaterally which may represent emphysema. Electronically Signed   By: Jamie Kato.D.  On: 10/27/2018 19:40     Scheduled Meds: . pantoprazole (PROTONIX) IV  40 mg Intravenous Q12H   Continuous Infusions: . sodium chloride 75 mL/hr at 10/28/18 2350     LOS: 2 days    Time spent: 25 minutes spent in the coordination of care today.   Jonnie Finner, DO [facility] Pager [phone]  If 7PM-7AM, please contact night-coverage [email or web address] Password [facility] 10/29/2018, 7:36 AM

## 2018-10-29 NOTE — Progress Notes (Signed)
Daughter, Angela Nevin updated on status of patient. Informed this RN that patient's sister [name] is POA.

## 2018-10-29 NOTE — Progress Notes (Signed)
Patient ID: Kristine Cox, female   DOB: Jun 04, 1940, 78 y.o.   MRN: [id number]  Nonbloody stools this morning. No rectal bleeding or hematemesis reported overnight upon review of chart. Hgb 8.1 (9.1). Continue Protonix IV until on solid food and then change to PO PPI BID. Start clear liquid diet. Aspiration precautions. No plans for EGD unless hemodynamic instability develops with recurrence of bleeding. Dr. Oletta Lamas available for [facility] tomorrow.

## 2018-10-30 LAB — RENAL FUNCTION PANEL
Albumin: 2.6 g/dL — ABNORMAL LOW (ref 3.5–5.0)
Anion gap: 6 (ref 5–15)
BUN: 9 mg/dL (ref 8–23)
CO2: 21 mmol/L — ABNORMAL LOW (ref 22–32)
Calcium: 8.3 mg/dL — ABNORMAL LOW (ref 8.9–10.3)
Chloride: 116 mmol/L — ABNORMAL HIGH (ref 98–111)
Creatinine, Ser: 0.59 mg/dL (ref 0.44–1.00)
GFR calc Af Amer: >60 mL/min (ref >60)
GFR calc non Af Amer: >60 mL/min (ref >60)
Glucose, Bld: 94 mg/dL (ref 70–99)
Phosphorus: 1.7 mg/dL — ABNORMAL LOW (ref 2.5–4.6)
Potassium: 3.3 mmol/L — ABNORMAL LOW (ref 3.5–5.1)
Sodium: 143 mmol/L (ref 135–145)

## 2018-10-30 LAB — CBC WITH DIFFERENTIAL/PLATELET
Abs Immature Granulocytes: 0.06 10*3/uL (ref 0.00–0.07)
Basophils Absolute: 0 10*3/uL (ref 0.0–0.1)
Basophils Relative: 0 %
Eosinophils Absolute: 0.2 10*3/uL (ref 0.0–0.5)
Eosinophils Relative: 2 %
HCT: 23.6 % — ABNORMAL LOW (ref 36.0–46.0)
Hemoglobin: 7.4 g/dL — ABNORMAL LOW (ref 12.0–15.0)
Immature Granulocytes: 1 %
Lymphocytes Relative: 23 %
Lymphs Abs: 2.3 10*3/uL (ref 0.7–4.0)
MCH: 29.7 pg (ref 26.0–34.0)
MCHC: 31.4 g/dL (ref 30.0–36.0)
MCV: 94.8 fL (ref 80.0–100.0)
Monocytes Absolute: 1 10*3/uL (ref 0.1–1.0)
Monocytes Relative: 9 %
Neutro Abs: 6.7 10*3/uL (ref 1.7–7.7)
Neutrophils Relative %: 65 %
Platelets: 216 10*3/uL (ref 150–400)
RBC: 2.49 MIL/uL — ABNORMAL LOW (ref 3.87–5.11)
RDW: 14.3 % (ref 11.5–15.5)
WBC: 10.3 10*3/uL (ref 4.0–10.5)
nRBC: 0 % (ref 0.0–0.2)

## 2018-10-30 LAB — D-DIMER, QUANTITATIVE: D-Dimer, Quant: 0.83 ug/mL-FEU — ABNORMAL HIGH (ref 0.00–0.50)

## 2018-10-30 LAB — C-REACTIVE PROTEIN: CRP: <0.8 mg/dL (ref <1.0)

## 2018-10-30 LAB — FERRITIN: Ferritin: 134 ng/mL (ref 11–307)

## 2018-10-30 LAB — MAGNESIUM: Magnesium: 1.8 mg/dL (ref 1.7–2.4)

## 2018-10-30 MED ORDER — MAGNESIUM SULFATE 50 % IJ SOLN: 1.0000 g | Freq: Once | INTRAMUSCULAR | Status: DC

## 2018-10-30 MED ORDER — MAGNESIUM SULFATE IN D5W 1-5 GM/100ML-% IV SOLN
1.0000 g | Freq: Once | INTRAVENOUS | Status: AC
  Administered 2018-10-30: 1 g via INTRAVENOUS
  Filled 2018-10-30: qty 100

## 2018-10-30 NOTE — Progress Notes (Signed)
Marland Kitchen  PROGRESS NOTE    Kristine Cox  [id number] DOB: 08-13-1940 DOA: 10/27/2018 PCP: Haywood Pao, MD   Brief Narrative:   Kristine Cox [id number] y.o.female,with history of dementia, GERD, hypertension, hyperlipidemia was brought to the ED from skilled nursing facility. Patient is a poor historian. History obtained from the ED notes. As per staff at the facility patient was found on the floor. Patient reported that she felt like she was going to pass out. She was more dysarthric than her baseline. Patient had emesis on the floor it was coffee-ground in appearance with large blood clots. Patient became more alert and at baseline mental status prior to leaving the facility. She had no previous episodes of vomiting. Patient did not have any recent fever or chills. She was tested positive for COVID-19 7 days ago after multiple residents at facility were positive. Patient was asymptomatic. She was started on Lovenox on 18th and has been taking it twice a day.  10/30/2018: Patient seen.  No significant history from patient.  Hemoglobin is 7.4 g/dL today, potassium of 3.3, magnesium of 1.8, d-dimer of 0.83, ferritin of 134.  Vital signs are stable.  Updated patient's sister and power of attorney, Kristine Cox on phone number [phone].   Assessment & Plan:   Active Problems:   Dementia without behavioral disturbance (HCC)   GI bleed   Acute blood loss anemia   COVID-19 virus infection   Hypokalemia   GI bleed     -As per prior documentation, patient presented with coffee-ground emesis.      -Patient was on NSAIDs prior to admission, as well as, subcutaneous Lovenox twice daily for possible progression for COVID coagulopathy.      - No further episodes of vomiting reported.       - Protonix 40 mg IV q12h.       - Hgb is 7.4 g/dL today.       -Conservative management as per GI team.    Acute blood loss anemia     - At admission, Hgb was 10.5; hemoglobin was 11.5 on  06/22/2018.     - Hgb 7.4 g/dL today.     -Continue to monitor closely.    COVID-19 positive     - patient had positive COVID-19 test a week ago.     - She is currently asymptomatic.       - Chest x-ray shows no acute abnormality.       - She is not requiring oxygen; monitor  Dementia     - No behavior disturbance noted at this time.  Hypokalemia     - replete, monitor  DVT prophylaxis: SCDs Code Status: FULL   Disposition Plan: TBD  Consultants:   GI   Subjective: No new complaints. No significant history from patient.  Objective: Vitals:   10/29/18 1257 10/29/18 2012 10/30/18 0547 10/30/18 1402  BP: 136/81 (!) 155/94 124/72 (!) 141/75  Pulse: 77 84 71 78  Resp: 16 20 18 16   Temp: 98.2 F (36.8 C) 98.6 F (37 C) 98.3 F (36.8 C) 98.3 F (36.8 C)  TempSrc: Axillary Oral Oral Axillary  SpO2: 94% 95% 95% 96%  Weight:      Height:        Intake/Output Summary (Last 24 hours) at 10/30/2018 1439 Last data filed at 10/30/2018 1334 Gross per 24 hour  Intake 1278.87 ml  Output 0 ml  Net 1278.87 ml   Filed Weights   10/28/18 0544  Weight: 58.8 kg    Examination:  General: 78 y.o. female resting in bed in NAD Eyes: PERRL, normal sclera ENMT: Nares patent w/o discharge, orophaynx clear, dentition normal, ears w/o discharge/lesions/ulcers Cardiovascular: RRR, +S1, S2, no m/g/r, equal pulses throughout Respiratory: CTABL, no w/r/r, normal WOB GI: BS+, NDNT, no masses noted, no organomegaly noted MSK: No e/c/c Neuro: alert to name, follows commands, no focal deficits   Data Reviewed: I have personally reviewed following labs and imaging studies.  CBC: Recent Labs  Lab 10/27/18 1959 10/28/18 0842 10/29/18 0535 10/30/18 0416  WBC 13.6* 11.1* 10.5 10.3  NEUTROABS 10.5* 7.6 6.6 6.7  HGB 10.5* 9.1* 8.1* 7.4*  HCT 33.6* 28.3* 24.9* 23.6*  MCV 92.3 91.9 92.9 94.8  PLT 265 254 246 [id number]   Basic Metabolic Panel: Recent Labs  Lab 10/27/18 1959 10/28/18  0842 10/29/18 0535 10/30/18 0416  NA 140 142 146* 143  K 3.7 3.5 2.9* 3.3*  CL 106 110 116* 116*  CO2 25 22 21* 21*  GLUCOSE 151* 109* 97 94  BUN 37* 43* 21 9  CREATININE 0.88 0.75 0.70 0.59  CALCIUM 8.9 8.7* 8.6* 8.3*  MG  --  1.8 1.8 1.8  PHOS  --  2.9 2.5 1.7*   GFR: Estimated Creatinine Clearance: 47.9 mL/min (by C-G formula based on SCr of 0.59 mg/dL). Liver Function Tests: Recent Labs  Lab 10/27/18 1959 10/28/18 0842 10/29/18 0535 10/30/18 0416  AST 21  --   --   --   ALT 14  --   --   --   ALKPHOS 62  --   --   --   BILITOT 0.7  --   --   --   PROT 6.5  --   --   --   ALBUMIN 3.1* 2.9* 2.8* 2.6*   No results for input(s): LIPASE, AMYLASE in the last 168 hours. No results for input(s): AMMONIA in the last 168 hours. Coagulation Profile: No results for input(s): INR, PROTIME in the last 168 hours. Cardiac Enzymes: No results for input(s): CKTOTAL, CKMB, CKMBINDEX, TROPONINI in the last 168 hours. BNP (last 3 results) No results for input(s): PROBNP in the last 8760 hours. HbA1C: No results for input(s): HGBA1C in the last 72 hours. CBG: No results for input(s): GLUCAP in the last 168 hours. Lipid Profile: No results for input(s): CHOL, HDL, LDLCALC, TRIG, CHOLHDL, LDLDIRECT in the last 72 hours. Thyroid Function Tests: No results for input(s): TSH, T4TOTAL, FREET4, T3FREE, THYROIDAB in the last 72 hours. Anemia Panel: Recent Labs    10/29/18 0535 10/30/18 0416  FERRITIN 158 134   Sepsis Labs: No results for input(s): PROCALCITON, LATICACIDVEN in the last 168 hours.  Recent Results (from the past 240 hour(s))  SARS Coronavirus 2 [facility] order, Performed in [facility] hospital lab) Nasopharyngeal Nasopharyngeal Swab     Status: Abnormal   Collection Time: 10/27/18  9:37 PM   Specimen: Nasopharyngeal Swab  Result Value Ref Range Status   SARS Coronavirus 2 POSITIVE (A) NEGATIVE Final    Comment: RESULT CALLED TO, READ BACK BY AND VERIFIED WITH:  WALKER,T @ 2340 ON 10/27/18 BY JUW Performed at [facility], [address] [place], [place] [address]       Radiology Studies: No results found.   Scheduled Meds: . pantoprazole (PROTONIX) IV  40 mg Intravenous Q12H  . potassium chloride  40 mEq Oral BID   Continuous Infusions: . sodium chloride 75 mL/hr at 10/29/18 1323  LOS: 3 days    Time spent: 25 minutes.   Bonnell Public, MD  [facility] Pager [phone].    If 7PM-7AM, please contact night-coverage [email or web address] Password [facility] 10/30/2018, 2:39 PM

## 2018-10-30 NOTE — Progress Notes (Signed)
Patient assisted with calling family on phone, she spoke with daughter Angela Nevin and relative Louie Casa.

## 2018-10-30 NOTE — Plan of Care (Addendum)
Pt appears to be comfortable. Denies pain. Pt has pulled out 2 IV's today. Pt became combative when RN attempted to insert new IV. Pt refusing to allow IV Team to attempt another stick. On call notified.

## 2018-10-30 NOTE — Progress Notes (Signed)
Patient has been up to the chair for lunch, and for dinner today. Patient has been able to eat jello, and drink fluids. Daughter, Kristine Cox has been updated twice by this RN at 10am, and 6pm. Sister has been updated by the doctor.

## 2018-10-31 DIAGNOSIS — E876 Hypokalemia: Secondary | ICD-10-CM

## 2018-10-31 LAB — CBC
HCT: 26.5 % — ABNORMAL LOW (ref 36.0–46.0)
Hemoglobin: 8.4 g/dL — ABNORMAL LOW (ref 12.0–15.0)
MCH: 30.1 pg (ref 26.0–34.0)
MCHC: 31.7 g/dL (ref 30.0–36.0)
MCV: 95 fL (ref 80.0–100.0)
Platelets: 257 10*3/uL (ref 150–400)
RBC: 2.79 MIL/uL — ABNORMAL LOW (ref 3.87–5.11)
RDW: 14.6 % (ref 11.5–15.5)
WBC: 11.1 10*3/uL — ABNORMAL HIGH (ref 4.0–10.5)
nRBC: 0 % (ref 0.0–0.2)

## 2018-10-31 LAB — RENAL FUNCTION PANEL
Albumin: 2.8 g/dL — ABNORMAL LOW (ref 3.5–5.0)
Anion gap: 6 (ref 5–15)
BUN: 5 mg/dL — ABNORMAL LOW (ref 8–23)
CO2: 23 mmol/L (ref 22–32)
Calcium: 8.4 mg/dL — ABNORMAL LOW (ref 8.9–10.3)
Chloride: 111 mmol/L (ref 98–111)
Creatinine, Ser: 0.62 mg/dL (ref 0.44–1.00)
GFR calc Af Amer: >60 mL/min (ref >60)
GFR calc non Af Amer: >60 mL/min (ref >60)
Glucose, Bld: 108 mg/dL — ABNORMAL HIGH (ref 70–99)
Phosphorus: 2.1 mg/dL — ABNORMAL LOW (ref 2.5–4.6)
Potassium: 3.5 mmol/L (ref 3.5–5.1)
Sodium: 140 mmol/L (ref 135–145)

## 2018-10-31 LAB — MAGNESIUM: Magnesium: 2 mg/dL (ref 1.7–2.4)

## 2018-10-31 MED ORDER — PANTOPRAZOLE SODIUM 40 MG PO TBEC
40.0000 mg | DELAYED_RELEASE_TABLET | Freq: Two times a day (BID) | ORAL | Status: DC
  Administered 2018-10-31 – 2018-11-04 (×9): 40 mg via ORAL
  Filled 2018-10-31 (×9): qty 1

## 2018-10-31 NOTE — TOC Progression Note (Signed)
Transition of Care [facility]) - Progression Note    Patient Details  Name: Kristine Cox MRN: [id number] Date of Birth: [birth year]  Transition of [facility] CM/SW Contact  Purcell Mouton, RN Phone Number: 10/31/2018, 12:56 PM  Clinical Narrative:    Ezequiel Kayser ith pt's daughter Adaline Sill [phone] concerning discharge plan. Pt is from [place] resident. Plans are for her to return.     Expected Discharge Plan: Skilled Nursing Facility([facility]    Expected Discharge Plan and Services Expected Discharge Plan: [place])   Discharge Planning Services: [place] arrangements for the past 2 months: [place]                                       Social Determinants of Health (SDOH) Interventions    Readmission Risk Interventions No flowsheet data found.

## 2018-10-31 NOTE — Care Management Important Message (Signed)
Important Message  Patient Details IM Letter given to [name] to present to the Patient Name: Kristine Cox MRN: [id number] Date of Birth: 09/11/1940   Medicare Important Message Given:  Yes     Kerin Salen 10/31/2018, 10:47 AM

## 2018-10-31 NOTE — Progress Notes (Signed)
[facility] PROGRESS NOTE  Kristine Cox [id number] DOB: [birth year] DOA: 10/27/2018 PCP: Haywood Pao, MD  Brief  summary   78 y.o.female,with history of dementia, GERD, hypertension, hyperlipidemia was brought to the ED from skilled nursing facility. Patient is a poor historian. History obtained from the ED notes. As per staff at the facility patient was found on the floor. Patient reported that she felt like she was going to pass out. She was more dysarthric than her baseline. Patient had emesis on the floor it was coffee-ground in appearance with large blood clots. Patient became more alert and at baseline mental status prior to leaving the facility. She had no previous episodes of vomiting. Patient did not have any recent fever or chills. She was tested positive for COVID-19 7 days ago after multiple residents at facility were positive. Patient was asymptomatic. She was started on Lovenox on 18th and has been taking it twice a day.   Assessment/Plan:  GI bleed. As per prior documentation, patient presented with coffee-ground emesis. Patient was on NSAIDs prior to admission, as well as, subcutaneous Lovenox twice daily for possible progression for COVID coagulopathy.  - No further episodes of vomiting. Cont Protonix, will transition top PO -Conservative management as per GI team. advance diet.  Hg is 8.4. monitor   Acute blood loss anemia. At admission, Hgb was 10.5; hemoglobin was 11.5 on 06/22/2018. -Hg remains stable. Recheck AM.   COVID-19 positive. patient had positive COVID-19 test a week ago. - She is currently asymptomatic.  - Chest x-ray shows no acute abnormality.  - She is not requiring oxygen; monitor  Dementia. No behavior disturbance noted at this time.  Hypokalemia. repleted, monitor  Dispo: transition from IV to PO PPI. Advance diet.   Code Status: full Family Communication: d/w patient, RN (indicate person spoken with,  relationship, and if by phone, the number) Disposition Plan: SNF 24-48 hrs    Consultants:  GI  Procedures:  none  Antibiotics: Anti-infectives (From admission, onward)   None        (indicate start date, and stop date if known)  HPI/Subjective: Asking for food. No distress. No vomiting. No further s/s of gi bleeding. Will advance diet.   Objective: Vitals:   10/30/18 2156 10/31/18 0417  BP: 136/73 112/72  Pulse: 67 68  Resp: 18 16  Temp: 98.1 F (36.7 C) 99.1 F (37.3 C)  SpO2: 96% 96%    Intake/Output Summary (Last 24 hours) at 10/31/2018 1235 Last data filed at 10/31/2018 0200 Gross per 24 hour  Intake 1359.51 ml  Output -  Net 1359.51 ml   Filed Weights   10/28/18 0544  Weight: 58.8 kg    Exam:   General:  No distress   Cardiovascular: s1.,s2 rrr  Respiratory: CTA BL  Abdomen: soft, nt   Musculoskeletal: no leg edema    Data Reviewed: Basic Metabolic Panel: Recent Labs  Lab 10/27/18 1959 10/28/18 0842 10/29/18 0535 10/30/18 0416 10/31/18 0439  NA 140 142 146* 143 140  K 3.7 3.5 2.9* 3.3* 3.5  CL 106 110 116* 116* 111  CO2 25 22 21* 21* 23  GLUCOSE 151* 109* 97 94 108*  BUN 37* 43* 21 9 5*  CREATININE 0.88 0.75 0.70 0.59 0.62  CALCIUM 8.9 8.7* 8.6* 8.3* 8.4*  MG  --  1.8 1.8 1.8 2.0  PHOS  --  2.9 2.5 1.7* 2.1*   Liver Function Tests: Recent Labs  Lab 10/27/18 1959 10/28/18 0842 10/29/18 0535 10/30/18  [id number] 10/31/18 0439  AST 21  --   --   --   --   ALT 14  --   --   --   --   ALKPHOS 62  --   --   --   --   BILITOT 0.7  --   --   --   --   PROT 6.5  --   --   --   --   ALBUMIN 3.1* 2.9* 2.8* 2.6* 2.8*   No results for input(s): LIPASE, AMYLASE in the last 168 hours. No results for input(s): AMMONIA in the last 168 hours. CBC: Recent Labs  Lab 10/27/18 1959 10/28/18 0842 10/29/18 0535 10/30/18 0416 10/31/18 0439  WBC 13.6* 11.1* 10.5 10.3 11.1*  NEUTROABS 10.5* 7.6 6.6 6.7  --   HGB 10.5* 9.1* 8.1* 7.4* 8.4*   HCT 33.6* 28.3* 24.9* 23.6* 26.5*  MCV 92.3 91.9 92.9 94.8 95.0  PLT 265 254 246 216 257   Cardiac Enzymes: No results for input(s): CKTOTAL, CKMB, CKMBINDEX, TROPONINI in the last 168 hours. BNP (last 3 results) No results for input(s): BNP in the last 8760 hours.  ProBNP (last 3 results) No results for input(s): PROBNP in the last 8760 hours.  CBG: No results for input(s): GLUCAP in the last 168 hours.  Recent Results (from the past 240 hour(s))  SARS Coronavirus 2 [facility] order, Performed in [facility] hospital lab) Nasopharyngeal Nasopharyngeal Swab     Status: Abnormal   Collection Time: 10/27/18  9:37 PM   Specimen: Nasopharyngeal Swab  Result Value Ref Range Status   SARS Coronavirus 2 POSITIVE (A) NEGATIVE Final    Comment: RESULT CALLED TO, READ BACK BY AND VERIFIED WITH: WALKER,T @ 2340 ON 10/27/18 BY JUW Performed at [facility], [address], [place], [place] [address]      Studies: No results found.  Scheduled Meds: . pantoprazole  40 mg Oral BID  . potassium chloride  40 mEq Oral BID   Continuous Infusions:  Active Problems:   Dementia without behavioral disturbance (HCC)   GI bleed   Acute blood loss anemia   COVID-19 virus infection   Hypokalemia    Time spent: >25 minutes     Kinnie Feil  [facility] Pager [phone]. If 7PM-7AM, please contact night-coverage at [email or web address], password [facility] 10/31/2018, 12:35 PM  LOS: 4 days

## 2018-10-31 NOTE — Progress Notes (Signed)
Called and updated daughter Angela Nevin with pt updates and pt was able to talk to Daughter.

## 2018-10-31 NOTE — Progress Notes (Signed)
Pt refused IV team to place new PIV after she pulled out previous IV's today. Primary RN aware and will notify MD.   Therese Sarah

## 2018-11-01 DIAGNOSIS — K922 Gastrointestinal hemorrhage, unspecified: Secondary | ICD-10-CM

## 2018-11-01 LAB — CBC
HCT: 27.2 % — ABNORMAL LOW (ref 36.0–46.0)
Hemoglobin: 8.6 g/dL — ABNORMAL LOW (ref 12.0–15.0)
MCH: 29.5 pg (ref 26.0–34.0)
MCHC: 31.6 g/dL (ref 30.0–36.0)
MCV: 93.2 fL (ref 80.0–100.0)
Platelets: 302 10*3/uL (ref 150–400)
RBC: 2.92 MIL/uL — ABNORMAL LOW (ref 3.87–5.11)
RDW: 14.5 % (ref 11.5–15.5)
WBC: 12.8 10*3/uL — ABNORMAL HIGH (ref 4.0–10.5)
nRBC: 0 % (ref 0.0–0.2)

## 2018-11-01 NOTE — Progress Notes (Signed)
Called and updated pt daughter Angela Nevin.

## 2018-11-01 NOTE — Progress Notes (Addendum)
[facility] PROGRESS NOTE  Kristine Cox [id number] DOB: [birth year] DOA: 10/27/2018 PCP: Haywood Pao, MD  Brief  summary   78 y.o.female,with history of dementia, GERD, hypertension, hyperlipidemia was brought to the ED from skilled nursing facility. Patient is a poor historian. History obtained from the ED notes. As per staff at the facility patient was found on the floor. Patient reported that she felt like she was going to pass out. She was more dysarthric than her baseline. Patient had emesis on the floor it was coffee-ground in appearance with large blood clots. Patient became more alert and at baseline mental status prior to leaving the facility. She had no previous episodes of vomiting. Patient did not have any recent fever or chills. She was tested positive for COVID-19 7 days ago after multiple residents at facility were positive. Patient was asymptomatic. She was started on Lovenox on 18th and has been taking it twice a day.   Assessment/Plan:  GI bleed. As per prior documentation, patient presented with coffee-ground emesis. Patient was on NSAIDs prior to admission, as well as, subcutaneous Lovenox twice daily for possible progression for COVID coagulopathy.  - No further episodes of vomiting. Cont Protonix, PO -Conservative management as per GI team. advance diet.  Hg is 8.4. monitor   Acute blood loss anemia. At admission, Hgb was 10.5; hemoglobin was 11.5 on 06/22/2018. -Hg remains stable. Recheck AM.   COVID-19 positive. patient had positive COVID-19 test a week ago. - She is currently asymptomatic.  - Chest x-ray shows no acute abnormality.  - She is not requiring oxygen; monitor  Dementia. No behavior disturbance noted at this time.  Hypokalemia. repleted, monitor  Dispo: Stable for discharge to SNF again tomorrow.  Code Status: full Family Communication: d/w patient, RN  Disposition Plan: SNF  tomorrow   Consultants:  GI  Procedures:  none  Antibiotics: Anti-infectives (From admission, onward)   None      HPI/Subjective: No acute distress no nausea no vomiting no fever no chills.  No chest pain abdominal pain.  No further bleeding reported.  Objective: Vitals:   11/01/18 0421 11/01/18 1256  BP: 128/70 123/83  Pulse: 79 96  Resp: 16 18  Temp: 99.8 F (37.7 C) 99 F (37.2 C)  SpO2: 96% 98%    Intake/Output Summary (Last 24 hours) at 11/01/2018 1719 Last data filed at 11/01/2018 0857 Gross per 24 hour  Intake 360 ml  Output 1200 ml  Net -840 ml   Filed Weights   10/28/18 0544  Weight: 58.8 kg    Exam:   General:  No distress   Cardiovascular: s1.,s2 rrr  Respiratory: CTA BL  Abdomen: soft, nt   Musculoskeletal: no leg edema    Data Reviewed: Basic Metabolic Panel: Recent Labs  Lab 10/27/18 1959 10/28/18 0842 10/29/18 0535 10/30/18 0416 10/31/18 0439  NA 140 142 146* 143 140  K 3.7 3.5 2.9* 3.3* 3.5  CL 106 110 116* 116* 111  CO2 25 22 21* 21* 23  GLUCOSE 151* 109* 97 94 108*  BUN 37* 43* 21 9 5*  CREATININE 0.88 0.75 0.70 0.59 0.62  CALCIUM 8.9 8.7* 8.6* 8.3* 8.4*  MG  --  1.8 1.8 1.8 2.0  PHOS  --  2.9 2.5 1.7* 2.1*   Liver Function Tests: Recent Labs  Lab 10/27/18 1959 10/28/18 0842 10/29/18 0535 10/30/18 0416 10/31/18 0439  AST 21  --   --   --   --   ALT 14  --   --   --   --  ALKPHOS 62  --   --   --   --   BILITOT 0.7  --   --   --   --   PROT 6.5  --   --   --   --   ALBUMIN 3.1* 2.9* 2.8* 2.6* 2.8*   No results for input(s): LIPASE, AMYLASE in the last 168 hours. No results for input(s): AMMONIA in the last 168 hours. CBC: Recent Labs  Lab 10/27/18 1959 10/28/18 0842 10/29/18 0535 10/30/18 0416 10/31/18 0439 11/01/18 0413  WBC 13.6* 11.1* 10.5 10.3 11.1* 12.8*  NEUTROABS 10.5* 7.6 6.6 6.7  --   --   HGB 10.5* 9.1* 8.1* 7.4* 8.4* 8.6*  HCT 33.6* 28.3* 24.9* 23.6* 26.5* 27.2*  MCV 92.3 91.9 92.9  94.8 95.0 93.2  PLT 265 254 246 216 257 302   Cardiac Enzymes: No results for input(s): CKTOTAL, CKMB, CKMBINDEX, TROPONINI in the last 168 hours. BNP (last 3 results) No results for input(s): BNP in the last 8760 hours.  ProBNP (last 3 results) No results for input(s): PROBNP in the last 8760 hours.  CBG: No results for input(s): GLUCAP in the last 168 hours.  Recent Results (from the past 240 hour(s))  SARS Coronavirus 2 [facility] order, Performed in [facility] hospital lab) Nasopharyngeal Nasopharyngeal Swab     Status: Abnormal   Collection Time: 10/27/18  9:37 PM   Specimen: Nasopharyngeal Swab  Result Value Ref Range Status   SARS Coronavirus 2 POSITIVE (A) NEGATIVE Final    Comment: RESULT CALLED TO, READ BACK BY AND VERIFIED WITH: WALKER,T @ 2340 ON 10/27/18 BY JUW Performed at [facility], [address], [place], [place] [address]      Studies: No results found.  Scheduled Meds: . pantoprazole  40 mg Oral BID  . potassium chloride  40 mEq Oral BID   Continuous Infusions:  Active Problems:   Dementia without behavioral disturbance (HCC)   GI bleed   Acute blood loss anemia   COVID-19 virus infection   Hypokalemia    Time spent: >25 minutes     [facility] If 7PM-7AM, please contact night-coverage at [email or web address], password [place] 11/01/2018, 5:19 PM  LOS: 5 days

## 2018-11-01 NOTE — TOC Progression Note (Signed)
Transition of Care [facility]) - Progression Note    Patient Details  Name: Kristine Cox MRN: [id number] Date of Birth: 11-Dec-1940  Transition of [facility]) CM/SW Contact  Purcell Mouton, RN Phone Number: 11/01/2018, 1:17 PM  Clinical Narrative:    This CM has called several times with no answer or call back from DN, Admissions or CN.     Expected Discharge Plan: Skilled Nursing Facility([facility])    Expected Discharge Plan and Services Expected Discharge Plan: [place])   Discharge Planning Services: [place] arrangements for the past 2 months: [place]                                       Social Determinants of Health (SDOH) Interventions    Readmission Risk Interventions No flowsheet data found.

## 2018-11-02 LAB — SARS CORONAVIRUS 2 (TAT 6-24 HRS): SARS Coronavirus 2: NEGATIVE

## 2018-11-02 MED ORDER — FE FUMARATE-B12-VIT C-FA-IFC PO CAPS
1.0000 | ORAL_CAPSULE | Freq: Every day | ORAL | Status: DC
  Administered 2018-11-02 – 2018-11-04 (×3): 1 via ORAL
  Filled 2018-11-02 (×3): qty 1

## 2018-11-02 NOTE — NC FL2 (Signed)
  [place] LEVEL OF CARE SCREENING TOOL     IDENTIFICATION  Patient Name: Kristine Cox Birthdate: 10/25/1940 Sex: female Admission Date (Current Location): 10/27/2018  [facility] and [place] Number:  Herbalist and Address:  [facility],  [place], [place]      Provider Number: [id number]  Attending Physician Name and Address:  Lavina Hamman, MD  Relative Name and Phone Number:  Adaline Sill [phone]    Current Level of Care: Hospital Recommended Level of Care: [place] Prior Approval Number:    Date Approved/Denied:   PASRR Number: [id number]  Discharge Plan: SNF    Current Diagnoses: Patient Active Problem List   Diagnosis Date Noted  . Hypokalemia 10/29/2018  . Acute blood loss anemia 10/28/2018  . COVID-19 virus infection 10/28/2018  . GI bleed 10/27/2018  . Dementia without behavioral disturbance ([place]) 05/19/2016    Orientation RESPIRATION BLADDER Height & Weight     Self  Normal Incontinent Weight: 58.8 kg Height:  5\' 3"  (160 cm)  BEHAVIORAL SYMPTOMS/MOOD NEUROLOGICAL BOWEL NUTRITION STATUS      Incontinent Diet(regular 2gm Sodium)  AMBULATORY STATUS COMMUNICATION OF NEEDS Skin   Total Care   Normal                       Personal Care Assistance Level of Assistance  Bathing, Feeding, Dressing Bathing Assistance: Maximum assistance Feeding assistance: Limited assistance Dressing Assistance: Maximum assistance     Functional Limitations Info             SPECIAL CARE FACTORS FREQUENCY  PT (By licensed PT), OT (By licensed OT)     PT Frequency: x5 OT Frequency: x5            Contractures      Additional Factors Info  Code Status, Allergies Code Status Info: FULL Allergies Info: Succinylcholine           Current Medications (11/02/2018):  This is the current hospital active medication list Current Facility-Administered Medications  Medication Dose Route  Frequency Provider Last Rate Last Dose  . acetaminophen (TYLENOL) tablet 650 mg  650 mg Oral Q6H PRN Oswald Hillock, MD       Or  . acetaminophen (TYLENOL) suppository 650 mg  650 mg Rectal Q6H PRN Oswald Hillock, MD      . ondansetron (ZOFRAN) tablet 4 mg  4 mg Oral Q6H PRN Oswald Hillock, MD       Or  . ondansetron (ZOFRAN) injection 4 mg  4 mg Intravenous Q6H PRN Oswald Hillock, MD      . pantoprazole (PROTONIX) EC tablet 40 mg  40 mg Oral BID Kinnie Feil, MD   40 mg at 11/02/18 0909  . potassium chloride SA (K-DUR) CR tablet 40 mEq  40 mEq Oral BID Marylyn Ishihara, Tyrone A, DO   40 mEq at 11/02/18 0909     Discharge Medications: Please see discharge summary for a list of discharge medications.  Relevant Imaging Results:  Relevant Lab Results:   Additional Information    Purcell Mouton, RN

## 2018-11-02 NOTE — Progress Notes (Signed)
[facility] PROGRESS NOTE  Kristine Cox [id number] DOB: [birth year] DOA: 10/27/2018 PCP: Haywood Pao, MD  Brief  summary   78 y.o.female,with history of dementia, GERD, hypertension, hyperlipidemia was brought to the ED from skilled nursing facility. Patient is a poor historian. History obtained from the ED notes. As per staff at the facility patient was found on the floor. Patient reported that she felt like she was going to pass out. She was more dysarthric than her baseline. Patient had emesis on the floor it was coffee-ground in appearance with large blood clots. Patient became more alert and at baseline mental status prior to leaving the facility. She had no previous episodes of vomiting. Patient did not have any recent fever or chills. She was tested positive for COVID-19 7 days ago after multiple residents at facility were positive. Patient was asymptomatic. She was started on Lovenox on 18th and has been taking it twice a day.   Assessment/Plan:  GI bleed. As per prior documentation, patient presented with coffee-ground emesis. Patient was on NSAIDs prior to admission, as well as, subcutaneous Lovenox twice daily for possible progression for COVID coagulopathy.  - No further episodes of vomiting. Cont Protonix, PO -Conservative management as per GI team. advance diet.  Hg is stable, monitor   Acute blood loss anemia. At admission, Hgb was 10.5; hemoglobin was 11.5 on 06/22/2018. -Hg remains stable  COVID-19 positive. patient had positive COVID-19 test a week ago. - She is currently asymptomatic.  - Chest x-ray shows no acute abnormality.  - She is not requiring oxygen; monitor  Dementia. No behavior disturbance noted at this time.  Hypokalemia. repleted, monitor  Dispo: Stable for discharge to SNF.  SNF is requested a COVID test.  Code Status: full Family Communication: d/w patient, RN daughter on 11/02/2018 Disposition Plan: SNF  tomorrow   Consultants:  GI  Procedures:  none  Antibiotics: Anti-infectives (From admission, onward)   None      HPI/Subjective: No acute events overnight.  Objective: Vitals:   11/02/18 0614 11/02/18 1340  BP: 112/90 119/85  Pulse: 94 89  Resp: 18 19  Temp: 100 F (37.8 C) 98.1 F (36.7 C)  SpO2: 98% 99%    Intake/Output Summary (Last 24 hours) at 11/02/2018 1713 Last data filed at 11/02/2018 0940 Gross per 24 hour  Intake 120 ml  Output 450 ml  Net -330 ml   Filed Weights   10/28/18 0544  Weight: 58.8 kg    Exam:  Data Reviewed: Basic Metabolic Panel: Recent Labs  Lab 10/27/18 1959 10/28/18 0842 10/29/18 0535 10/30/18 0416 10/31/18 0439  NA 140 142 146* 143 140  K 3.7 3.5 2.9* 3.3* 3.5  CL 106 110 116* 116* 111  CO2 25 22 21* 21* 23  GLUCOSE 151* 109* 97 94 108*  BUN 37* 43* 21 9 5*  CREATININE 0.88 0.75 0.70 0.59 0.62  CALCIUM 8.9 8.7* 8.6* 8.3* 8.4*  MG  --  1.8 1.8 1.8 2.0  PHOS  --  2.9 2.5 1.7* 2.1*   Liver Function Tests: Recent Labs  Lab 10/27/18 1959 10/28/18 0842 10/29/18 0535 10/30/18 0416 10/31/18 0439  AST 21  --   --   --   --   ALT 14  --   --   --   --   ALKPHOS 62  --   --   --   --   BILITOT 0.7  --   --   --   --  PROT 6.5  --   --   --   --   ALBUMIN 3.1* 2.9* 2.8* 2.6* 2.8*   No results for input(s): LIPASE, AMYLASE in the last 168 hours. No results for input(s): AMMONIA in the last 168 hours. CBC: Recent Labs  Lab 10/27/18 1959 10/28/18 0842 10/29/18 0535 10/30/18 0416 10/31/18 0439 11/01/18 0413  WBC 13.6* 11.1* 10.5 10.3 11.1* 12.8*  NEUTROABS 10.5* 7.6 6.6 6.7  --   --   HGB 10.5* 9.1* 8.1* 7.4* 8.4* 8.6*  HCT 33.6* 28.3* 24.9* 23.6* 26.5* 27.2*  MCV 92.3 91.9 92.9 94.8 95.0 93.2  PLT 265 254 246 216 257 302   Cardiac Enzymes: No results for input(s): CKTOTAL, CKMB, CKMBINDEX, TROPONINI in the last 168 hours. BNP (last 3 results) No results for input(s): BNP in the last 8760  hours.  ProBNP (last 3 results) No results for input(s): PROBNP in the last 8760 hours.  CBG: No results for input(s): GLUCAP in the last 168 hours.  Recent Results (from the past 240 hour(s))  SARS Coronavirus 2 [facility] order, Performed in [facility] hospital lab) Nasopharyngeal Nasopharyngeal Swab     Status: Abnormal   Collection Time: 10/27/18  9:37 PM   Specimen: Nasopharyngeal Swab  Result Value Ref Range Status   SARS Coronavirus 2 POSITIVE (A) NEGATIVE Final    Comment: RESULT CALLED TO, READ BACK BY AND VERIFIED WITH: WALKER,T @ 2340 ON 10/27/18 BY JUW Performed at [facility], [address] [place], [place] [address]      Studies: No results found.  Scheduled Meds: . ferrous [id number] C-folic acid  1 capsule Oral Daily  . pantoprazole  40 mg Oral BID  . potassium chloride  40 mEq Oral BID   Continuous Infusions:  Active Problems:   Dementia without behavioral disturbance (HCC)   GI bleed   Acute blood loss anemia   COVID-19 virus infection   Hypokalemia    Time spent: >25 minutes     [facility] If 7PM-7AM, please contact night-coverage at [email or web address], password [facility] 11/02/2018, 5:13 PM  LOS: 6 days

## 2018-11-02 NOTE — Plan of Care (Signed)
Patient eats small amounts of food at each meal being fed by staff, does drink liquids including some Ensure.  Patient up to chair with one max assist.  Patient pleasantly confused, oriented only to self.  This RN did speak to daughter on phone several times during shift and patient spoke with daughter as well.

## 2018-11-02 NOTE — TOC Progression Note (Signed)
Transition of Care [facility]) - Progression Note    Patient Details  Name: MABLEAN VENDITTO MRN: [id number] Date of Birth: 1940/11/02  [facility]) CM/SW Contact  Purcell Mouton, RN Phone Number: 11/02/2018, 12:11 PM  Clinical Narrative:    Spoke with pt's daughter Angela Nevin this morning concerning her mother being transferred back to [place]/several calls were made with no return answer. Larene Beach, Admission from [facility] return a call. They will take pt back, however she will need to get authorization from insurance company. Larene Beach also asked for repeat COVID test. Informed her that COVID test may not change related pt testing COVID positive 8/21. Larene Beach asked that H&P and progress notes be sent to [phone]. Charleen Kirks, CSW from [place] called to say pt would go to their sister facility: [facility] in [place], [place], [place] [address]. Explained to [place], the facility will need to transport related to the location being below [place], [place] and there was a closer option. Charleen Kirks, CSW states that [facility] will arrange transportation.     Expected Discharge Plan: Skilled Nursing Facility([facility])    Expected Discharge Plan and Services Expected Discharge Plan: [place])   Discharge Planning Services: [place] arrangements for the past 2 months: [place]                                       Social Determinants of Health (SDOH) Interventions    Readmission Risk Interventions No flowsheet data found.

## 2018-11-03 ENCOUNTER — Inpatient Hospital Stay (HOSPITAL_COMMUNITY): Payer: Medicare Other

## 2018-11-03 LAB — COMPREHENSIVE METABOLIC PANEL
ALT: 23 U/L (ref 0–44)
AST: 25 U/L (ref 15–41)
Albumin: 2.9 g/dL — ABNORMAL LOW (ref 3.5–5.0)
Alkaline Phosphatase: 79 U/L (ref 38–126)
Anion gap: 8 (ref 5–15)
BUN: 16 mg/dL (ref 8–23)
CO2: 25 mmol/L (ref 22–32)
Calcium: 8.7 mg/dL — ABNORMAL LOW (ref 8.9–10.3)
Chloride: 101 mmol/L (ref 98–111)
Creatinine, Ser: 0.89 mg/dL (ref 0.44–1.00)
GFR calc Af Amer: >60 mL/min (ref >60)
GFR calc non Af Amer: >60 mL/min (ref >60)
Glucose, Bld: 141 mg/dL — ABNORMAL HIGH (ref 70–99)
Potassium: 4.9 mmol/L (ref 3.5–5.1)
Sodium: 134 mmol/L — ABNORMAL LOW (ref 135–145)
Total Bilirubin: 0.8 mg/dL (ref 0.3–1.2)
Total Protein: 6.9 g/dL (ref 6.5–8.1)

## 2018-11-03 LAB — CBC WITH DIFFERENTIAL/PLATELET
Abs Immature Granulocytes: 0.12 10*3/uL — ABNORMAL HIGH (ref 0.00–0.07)
Basophils Absolute: 0.1 10*3/uL (ref 0.0–0.1)
Basophils Relative: 0 %
Eosinophils Absolute: 0.2 10*3/uL (ref 0.0–0.5)
Eosinophils Relative: 1 %
HCT: 28.6 % — ABNORMAL LOW (ref 36.0–46.0)
Hemoglobin: 9.1 g/dL — ABNORMAL LOW (ref 12.0–15.0)
Immature Granulocytes: 1 %
Lymphocytes Relative: 11 %
Lymphs Abs: 1.8 10*3/uL (ref 0.7–4.0)
MCH: 29.9 pg (ref 26.0–34.0)
MCHC: 31.8 g/dL (ref 30.0–36.0)
MCV: 94.1 fL (ref 80.0–100.0)
Monocytes Absolute: 2.5 10*3/uL — ABNORMAL HIGH (ref 0.1–1.0)
Monocytes Relative: 16 %
Neutro Abs: 11.2 10*3/uL — ABNORMAL HIGH (ref 1.7–7.7)
Neutrophils Relative %: 71 %
Platelets: 387 10*3/uL (ref 150–400)
RBC: 3.04 MIL/uL — ABNORMAL LOW (ref 3.87–5.11)
RDW: 14.6 % (ref 11.5–15.5)
WBC: 15.8 10*3/uL — ABNORMAL HIGH (ref 4.0–10.5)
nRBC: 0 % (ref 0.0–0.2)

## 2018-11-03 LAB — D-DIMER, QUANTITATIVE: D-Dimer, Quant: 1.24 ug/mL-FEU — ABNORMAL HIGH (ref 0.00–0.50)

## 2018-11-03 LAB — FERRITIN: Ferritin: 175 ng/mL (ref 11–307)

## 2018-11-03 LAB — C-REACTIVE PROTEIN: CRP: 15.2 mg/dL — ABNORMAL HIGH (ref <1.0)

## 2018-11-03 LAB — PROCALCITONIN: Procalcitonin: 0.17 ng/mL

## 2018-11-03 NOTE — Progress Notes (Signed)
Patient in bed resting, no distress noted. Spoke to patient's daughter and updated her on plan of care.

## 2018-11-03 NOTE — TOC Progression Note (Signed)
Transition of Care [facility]) - Progression Note    Patient Details  Name: Kristine Cox MRN: [id number] Date of Birth: 1940/08/02  Transition of Care [facility]) CM/SW Contact  Purcell Mouton, RN Phone Number: 11/03/2018, 11:16 AM  Clinical Narrative:    Going back and forth with [place] to [facility] for pt's return. Faxed COVID lab test from 8/27 which was negative to [phone]. Waiting for a call back.    Expected Discharge Plan: Skilled Nursing Facility([facility])    Expected Discharge Plan and Services Expected Discharge Plan: [place])   Discharge Planning Services: [place] arrangements for the past 2 months: [place]                                       Social Determinants of Health (SDOH) Interventions    Readmission Risk Interventions No flowsheet data found.

## 2018-11-03 NOTE — Care Management Important Message (Signed)
Important Message  Patient Details IM Letter given to [name] RN to present to the Patient Name: Kristine Cox MRN: [id number] Date of Birth: 10-Jan-1941   Medicare Important Message Given:  Yes     Kerin Salen 11/03/2018, 11:03 AM

## 2018-11-03 NOTE — Progress Notes (Signed)
[facility] PROGRESS NOTE  Kristine Cox [id number] DOB: 1941/02/22 DOA: 10/27/2018 PCP: Haywood Pao, MD  Brief  summary   78 y.o.female,with history of dementia, GERD, hypertension, hyperlipidemia was brought to the ED from skilled nursing facility. Patient is a poor historian. History obtained from the ED notes. As per staff at the facility patient was found on the floor. Patient reported that she felt like she was going to pass out. She was more dysarthric than her baseline. Patient had emesis on the floor it was coffee-ground in appearance with large blood clots. Patient became more alert and at baseline mental status prior to leaving the facility. She had no previous episodes of vomiting. Patient did not have any recent fever or chills. She was tested positive for COVID-19 7 days ago after multiple residents at facility were positive. Patient was asymptomatic. She was started on Lovenox on 18th and has been taking it twice a day.   Assessment/Plan:  GI bleed.  As per prior documentation, patient presented with coffee-ground emesis. Patient was on NSAIDs prior to admission, as well as, subcutaneous Lovenox twice daily for possible progression for COVID coagulopathy.  - No further episodes of vomiting. Cont Protonix, PO -Conservative management as per GI team. advance diet.  Hg is stable, monitor   Acute blood loss anemia.  At admission, Hgb was 10.5; hemoglobin was 11.5 on 06/22/2018. -Hg remains stable  COVID-19 positive.  patient had positive COVID-19 test a week ago. - She is currently asymptomatic.  - Chest x-ray shows no acute abnormality.  - She is not requiring oxygen; monitor  Dementia.  No behavior disturbance noted at this time.  Hypokalemia.  repleted, monitor  Fever on 11/03/2018. Patient had a low-grade temperature of 100.1 on 11/03/2018. Leukocytosis also seen. CRP d-dimer ferritin elevated. COVID test was negative  on 11/02/2018. Could be false negative. We will continue to closely monitor the patient. Chest x-ray unremarkable. UA pending. We will not initiate antibiotics for now.  Dispo: Stable for discharge to SNF.  SNF is requested a COVID test.  Code Status: full Family Communication: d/w patient, RN daughter on 11/02/2018 Disposition Plan: SNF tomorrow   Consultants:  GI  Procedures:  none  Antibiotics: Anti-infectives (From admission, onward)   None      HPI/Subjective: Low-grade temperature overnight.  No acute complaint.  No nausea no vomiting.  No diarrhea.  Objective: Vitals:   11/03/18 0636 11/03/18 1241  BP: 122/75 135/83  Pulse: 88 93  Resp: 18 16  Temp: 100 F (37.8 C) 97.7 F (36.5 C)  SpO2: 95% 98%    Intake/Output Summary (Last 24 hours) at 11/03/2018 1812 Last data filed at 11/03/2018 1738 Gross per 24 hour  Intake 280 ml  Output 300 ml  Net -20 ml   Filed Weights   10/28/18 0544  Weight: 58.8 kg    Exam:  Data Reviewed: Basic Metabolic Panel: Recent Labs  Lab 10/28/18 0842 10/29/18 0535 10/30/18 0416 10/31/18 0439 11/03/18 0736  NA 142 146* 143 140 134*  K 3.5 2.9* 3.3* 3.5 4.9  CL 110 116* 116* 111 101  CO2 22 21* 21* 23 25  GLUCOSE 109* 97 94 108* 141*  BUN 43* 21 9 5* 16  CREATININE 0.75 0.70 0.59 0.62 0.89  CALCIUM 8.7* 8.6* 8.3* 8.4* 8.7*  MG 1.8 1.8 1.8 2.0  --   PHOS 2.9 2.5 1.7* 2.1*  --    Liver Function Tests: Recent Labs  Lab 10/27/18 1959 10/28/18 [id number]  10/29/18 0535 10/30/18 0416 10/31/18 0439 11/03/18 0736  AST 21  --   --   --   --  25  ALT 14  --   --   --   --  23  ALKPHOS 62  --   --   --   --  79  BILITOT 0.7  --   --   --   --  0.8  PROT 6.5  --   --   --   --  6.9  ALBUMIN 3.1* 2.9* 2.8* 2.6* 2.8* 2.9*   No results for input(s): LIPASE, AMYLASE in the last 168 hours. No results for input(s): AMMONIA in the last 168 hours. CBC: Recent Labs  Lab 10/27/18 1959 10/28/18 0842 10/29/18 0535  10/30/18 0416 10/31/18 0439 11/01/18 0413 11/03/18 0736  WBC 13.6* 11.1* 10.5 10.3 11.1* 12.8* 15.8*  NEUTROABS 10.5* 7.6 6.6 6.7  --   --  11.2*  HGB 10.5* 9.1* 8.1* 7.4* 8.4* 8.6* 9.1*  HCT 33.6* 28.3* 24.9* 23.6* 26.5* 27.2* 28.6*  MCV 92.3 91.9 92.9 94.8 95.0 93.2 94.1  PLT 265 254 246 216 257 302 387   Cardiac Enzymes: No results for input(s): CKTOTAL, CKMB, CKMBINDEX, TROPONINI in the last 168 hours. BNP (last 3 results) No results for input(s): BNP in the last 8760 hours.  ProBNP (last 3 results) No results for input(s): PROBNP in the last 8760 hours.  CBG: No results for input(s): GLUCAP in the last 168 hours.  Recent Results (from the past 240 hour(s))  SARS Coronavirus 2 [facility] order, Performed in [facility] hospital lab) Nasopharyngeal Nasopharyngeal Swab     Status: Abnormal   Collection Time: 10/27/18  9:37 PM   Specimen: Nasopharyngeal Swab  Result Value Ref Range Status   SARS Coronavirus 2 POSITIVE (A) NEGATIVE Final    Comment: RESULT CALLED TO, READ BACK BY AND VERIFIED WITH: WALKER,T @ 2340 ON 10/27/18 BY JUW Performed at [facility], [address] [place], [place] [address]   SARS CORONAVIRUS 2 (TAT 6-12 HRS) Nasal Swab Aptima Multi Swab     Status: None   Collection Time: 11/02/18 12:53 PM   Specimen: Aptima Multi Swab; Nasal Swab  Result Value Ref Range Status   SARS Coronavirus 2 NEGATIVE NEGATIVE Final    Comment: (NOTE) SARS-CoV-2 target nucleic acids are NOT DETECTED. The SARS-CoV-2 RNA is generally detectable in upper and lower respiratory specimens during the acute phase of infection. Negative results do not preclude SARS-CoV-2 infection, do not rule out co-infections with other pathogens, and should not be used as the sole basis for treatment or other patient management decisions. Negative results must be combined with clinical observations, patient history, and epidemiological information. The expected result is Negative. Fact Sheet  for Patients: SugarRoll.be Fact Sheet for Healthcare Providers: https://www.woods-mathews.com/ This test is not yet approved or cleared by the [place] FDA and  has been authorized for detection and/or diagnosis of SARS-CoV-2 by FDA under an Emergency Use Authorization (EUA). This EUA will remain  in effect (meaning this test can be used) for the duration of the COVID-19 declaration under Section 56 4(b)(1) of the Act, 21 U.S.C. section 360bbb-3(b)(1), unless the authorization is terminated or revoked sooner. Performed at [facility], [place] [address], [place], [place] [address]      Studies: Dg Chest Port 1 View  Result Date: 11/03/2018 CLINICAL DATA:  Shortness of breath in a patient who is COVID-19 positive. EXAM: PORTABLE CHEST 1 VIEW COMPARISON:  Single-view of the chest 10/27/2018. PA and lateral chest 06/09/2016. FINDINGS: Lungs clear. Heart size normal. Aortic atherosclerosis. No pneumothorax or pleural fluid. No acute or bony abnormality. IMPRESSION: No acute disease. Atherosclerosis. Electronically Signed   By: Inge Rise M.D.   On: 11/03/2018 11:34    Scheduled Meds: . ferrous [id number] C-folic acid  1 capsule Oral Daily  . pantoprazole  40 mg Oral BID  . potassium chloride  40 mEq Oral BID   Continuous Infusions:  Active Problems:   Dementia without behavioral disturbance (HCC)   GI bleed   Acute blood loss anemia   COVID-19 virus infection   Hypokalemia    Time spent: >25 minutes     [facility] If 7PM-7AM, please contact night-coverage at [email or web address], password [facility] 11/03/2018, 6:12 PM  LOS: 7 days

## 2018-11-04 LAB — CBC WITH DIFFERENTIAL/PLATELET
Abs Immature Granulocytes: 0.12 10*3/uL — ABNORMAL HIGH (ref 0.00–0.07)
Basophils Absolute: 0.1 10*3/uL (ref 0.0–0.1)
Basophils Relative: 0 %
Eosinophils Absolute: 0.1 10*3/uL (ref 0.0–0.5)
Eosinophils Relative: 1 %
HCT: 29.5 % — ABNORMAL LOW (ref 36.0–46.0)
Hemoglobin: 9.3 g/dL — ABNORMAL LOW (ref 12.0–15.0)
Immature Granulocytes: 1 %
Lymphocytes Relative: 11 %
Lymphs Abs: 1.6 10*3/uL (ref 0.7–4.0)
MCH: 29.8 pg (ref 26.0–34.0)
MCHC: 31.5 g/dL (ref 30.0–36.0)
MCV: 94.6 fL (ref 80.0–100.0)
Monocytes Absolute: 2 10*3/uL — ABNORMAL HIGH (ref 0.1–1.0)
Monocytes Relative: 14 %
Neutro Abs: 10.6 10*3/uL — ABNORMAL HIGH (ref 1.7–7.7)
Neutrophils Relative %: 73 %
Platelets: 476 10*3/uL — ABNORMAL HIGH (ref 150–400)
RBC: 3.12 MIL/uL — ABNORMAL LOW (ref 3.87–5.11)
RDW: 14.5 % (ref 11.5–15.5)
WBC: 14.5 10*3/uL — ABNORMAL HIGH (ref 4.0–10.5)
nRBC: 0 % (ref 0.0–0.2)

## 2018-11-04 LAB — URINALYSIS, COMPLETE (UACMP) WITH MICROSCOPIC
Bacteria, UA: NONE SEEN
Bilirubin Urine: NEGATIVE
Glucose, UA: NEGATIVE mg/dL
Hgb urine dipstick: NEGATIVE
Ketones, ur: NEGATIVE mg/dL
Nitrite: NEGATIVE
Protein, ur: NEGATIVE mg/dL
Specific Gravity, Urine: 1.01 (ref 1.005–1.030)
pH: 6 (ref 5.0–8.0)

## 2018-11-04 LAB — C-REACTIVE PROTEIN: CRP: 11.6 mg/dL — ABNORMAL HIGH (ref <1.0)

## 2018-11-04 LAB — D-DIMER, QUANTITATIVE: D-Dimer, Quant: 1.38 ug/mL-FEU — ABNORMAL HIGH (ref 0.00–0.50)

## 2018-11-04 LAB — FERRITIN: Ferritin: 199 ng/mL (ref 11–307)

## 2018-11-04 MED ORDER — PANTOPRAZOLE SODIUM 40 MG PO TBEC: 40.0000 mg | DELAYED_RELEASE_TABLET | Freq: Two times a day (BID) | ORAL | 0 refills | Status: AC

## 2018-11-04 MED ORDER — POTASSIUM CHLORIDE CRYS ER 20 MEQ PO TBCR: 20.0000 meq | EXTENDED_RELEASE_TABLET | Freq: Two times a day (BID) | ORAL | 0 refills | Status: AC

## 2018-11-04 MED ORDER — FE FUMARATE-B12-VIT C-FA-IFC PO CAPS: 1.0000 | ORAL_CAPSULE | Freq: Every day | ORAL | 0 refills | Status: AC

## 2018-11-04 MED ORDER — FE FUMARATE-B12-VIT C-FA-IFC PO CAPS: 1.0000 | ORAL_CAPSULE | Freq: Every day | ORAL | 0 refills | Status: DC

## 2018-11-04 MED ORDER — PANTOPRAZOLE SODIUM 40 MG PO TBEC: 40.0000 mg | DELAYED_RELEASE_TABLET | Freq: Two times a day (BID) | ORAL | 0 refills | Status: DC

## 2018-11-04 MED ORDER — POTASSIUM CHLORIDE CRYS ER 20 MEQ PO TBCR: 20.0000 meq | EXTENDED_RELEASE_TABLET | Freq: Two times a day (BID) | ORAL | 0 refills | Status: DC

## 2018-11-04 NOTE — TOC Transition Note (Addendum)
Transition of Care [facility]) - CM/SW Discharge Note   Patient Details  Name: Kristine Cox MRN: [id number] Date of Birth: August 08, 1940  Transition of Care [facility] CM/SW Contact:  Nila Nephew, LCSW Phone Number: [phone] 11/04/2018, 2:44 PM   Clinical Narrative:   Pt admitting to [facility] today report [phone]. Will arrange ambulance transport. (Pt is resident there and returning for rehab- per facility received [facility] Medicare [place]) Provided DC summary to facility (supervisor [place] who confirmed with facility SW that readmission approved). Pt's daughter updated and happy pt is to return to her SNF.     Final next level of care: [place]     Patient Goals and CMS Choice     Choice offered to / list presented to : Adult Children(Daughter Adaline Sill)  Discharge Placement                       Discharge Plan and Services   Discharge Planning Services: CM Consult                                 Social Determinants of Health (SDOH) Interventions     Readmission Risk Interventions No flowsheet data found.

## 2018-11-04 NOTE — Discharge Summary (Signed)
[facility] Discharge Summary   Patient: Kristine Cox [id number]   PCP: Haywood Pao, MD DOB: 1940-12-28   Date of admission: 10/27/2018   Date of discharge:  11/04/2018    Discharge Diagnoses:  Principal diagnosis Acute COVID-19 infection.  Active Problems:   Dementia without behavioral disturbance (HCC)   GI bleed   Acute blood loss anemia   COVID-19 virus infection   Hypokalemia   Admitted From: SNF Disposition:  SNF   Recommendations for Outpatient Follow-up:  1. Please follow-up with PCP in 1 week.  Patient will require a repeat BMP for potassium level as well as a repeat CBC checkup in 1 week.   Contact information for follow-up providers    Tisovec, Fransico Him, MD. Schedule an appointment as soon as possible for a visit in 1 week(s).   Specialty: Internal Medicine Why: repeat CBC and potassium level.  Contact information: [address] [place] [address] [phone]            Contact information for after-discharge care    [facility] .   Service: Skilled Nursing Contact information: [place] [phone]                 Diet recommendation: Cardiac diet  Activity: The patient is advised to gradually reintroduce usual activities,as tolerated .  Discharge Condition: good  Code Status: Full code  History of present illness: As per the H and P dictated on admission, "Kristine Cox  is a 78 y.o. female, with history of dementia, GERD, hypertension, hyperlipidemia was brought to the ED from skilled nursing facility.  Patient is a poor historian.  History obtained from the ED notes.  As per staff at the facility patient was found on the floor.  Patient reported that she felt like she was going to pass out.  She was more dysarthric than her baseline.  Patient had emesis on the floor it was coffee-ground in appearance with large blood clots.  Patient became more alert  and at baseline mental status prior to leaving the facility.  She had no previous episodes of vomiting. Patient did not have any recent fever or chills.  She was tested positive for COVID-19 7 days ago after multiple residents at facility were positive.  Patient was asymptomatic.  She was started on Lovenox on 18th and has been taking it twice a day.  Patient denies chest pain or shortness of breath. No history of fever or chills. She is not requiring oxygen. Chest x-ray in the ED is unremarkable. Denies abdominal pain."  Hospital Course:  Summary of her active problems in the hospital is as following. GI bleed.  As per prior documentation, patient presented with coffee-ground emesis. Patient was on NSAIDs prior to admission, as well as, subcutaneous Lovenox twice daily for possible progression for COVID coagulopathy. No further episodes of vomiting. Cont Protonix, PO Conservative management as per GI team.advance diet. Hg is stable, monitor   Acute blood loss anemia.  At admission, Hgb was 10.5; hemoglobin was 11.5 on 06/22/2018. -Hg remains stable  COVID-19 positive.  patient had positive COVID-19 test a week ago. She is currently asymptomatic.  Chest x-ray shows no acute abnormality.  She is not requiring oxygen; monitor  Dementia.  No behavior disturbance noted at this time.  Hypokalemia.  repleted, monitor  Fever on 11/03/2018. Patient had a low-grade temperature of 100.1 on 11/03/2018. Leukocytosis also seen. CRP d-dimer ferritin elevated.  COVID test was negative on 11/02/2018. Could be false negative. Chest x-ray unremarkable. Since admission does not have any evidence of active infection we will not initiate antibiotics for now.  But will require close follow-up given elevated inflammatory markers.   Patient was seen by physical therapy, who recommended SNF, which was arranged by Education officer, museum. On the day of the discharge the patient's vitals were stable,  and no other acute medical condition were reported by patient. the patient was felt safe to be discharge at SNF with SNF.  Consultants: none Procedures: none  DISCHARGE MEDICATION: Allergies as of 11/04/2018      Reactions   Succinylcholine Other (See Comments)   Unknown reaction       Medication List    STOP taking these medications   enoxaparin 40 MG/0.4ML injection Commonly known as: LOVENOX     TAKE these medications   divalproex 250 MG DR tablet Commonly known as: DEPAKOTE Take 250 mg by mouth 2 (two) times daily. What changed: Another medication with the same name was removed. Continue taking this medication, and follow the directions you see here.   docusate sodium 100 MG capsule Commonly known as: COLACE Take 100 mg by mouth daily.   donepezil 10 MG tablet Commonly known as: ARICEPT Take 10 mg by mouth at bedtime.   ferrous Q000111Q C-folic acid capsule Commonly known as: TRINSICON / FOLTRIN Take 1 capsule by mouth daily. Start taking on: November 05, 2018   Fish Oil 1000 MG Caps Take 1 capsule by mouth 2 (two) times daily.   FLUoxetine 20 MG capsule Commonly known as: PROZAC Take 2 tablets daily   nabumetone 750 MG tablet Commonly known as: RELAFEN Take 750 mg by mouth daily.   Namenda 10 MG tablet Generic drug: memantine Take 10 mg by mouth 2 (two) times daily.   pantoprazole 40 MG tablet Commonly known as: PROTONIX Take 1 tablet (40 mg total) by mouth 2 (two) times daily before a meal.   potassium chloride SA 20 MEQ tablet Commonly known as: K-DUR Take 1 tablet (20 mEq total) by mouth 2 (two) times daily.   pravastatin 40 MG tablet Commonly known as: PRAVACHOL Take 40 mg by mouth daily.   QUEtiapine 25 MG tablet Commonly known as: SEROQUEL Take 1 tablet (25 mg total) by mouth 2 (two) times daily. What changed: when to take this   Vitamin D (Cholecalciferol) 25 MCG (1000 UT) Tabs Take 2 tablets by mouth daily.       Allergies  Allergen Reactions   Succinylcholine Other (See Comments)    Unknown reaction    Discharge Instructions    Diet - low sodium heart healthy   Complete by: As directed    Discharge instructions   Complete by: As directed    It is important that you read the given instructions as well as go over your medication list with RN to help you understand your care after this hospitalization.  Discharge Instructions: Please follow-up with PCP in 1-2 weeks  Please request your primary care physician to go over all Hospital Tests and Procedure/Radiological results at the follow up. Please get all Hospital records sent to your PCP by signing hospital release before you go home.   Do not take more than prescribed Pain, Sleep and Anxiety Medications. You were cared for by a hospitalist during your hospital stay. If you have any questions about your discharge medications or the care you received while you were in the hospital after you  are discharged, you can call the unit @UNIT @ you were admitted to and ask to speak with the hospitalist on call if the hospitalist that took care of you is not available.  Once you are discharged, your primary care physician will handle any further medical issues. Please note that NO REFILLS for any discharge medications will be authorized once you are discharged, as it is imperative that you return to your primary care physician (or establish a relationship with a primary care physician if you do not have one) for your aftercare needs so that they can reassess your need for medications and monitor your lab values. You Must read complete instructions/literature along with all the possible adverse reactions/side effects for all the Medicines you take and that have been prescribed to you. Take any new Medicines after you have completely understood and accept all the possible adverse reactions/side effects.   Increase activity slowly   Complete by: As directed       Discharge Exam: Filed Weights   10/28/18 0544  Weight: 58.8 kg   Vitals:   11/04/18 0335 11/04/18 1337  BP: 129/74 127/68  Pulse: 88 (!) 102  Resp: 16 18  Temp: 98 F (36.7 C) 99.1 F (37.3 C)  SpO2: 97% 96%   General: Appear in no distress, no Rash; Oral Mucosa Clear, moist. no Abnormal Mass Or lumps Cardiovascular: S1 and S2 Present, no Murmur, Respiratory: normal respiratory effort, Bilateral Air entry present and Clear to Auscultation, no Crackles, no wheezes Abdomen: Bowel Sound present, Soft and no tenderness, no hernia Extremities: no Pedal edema, no calf tenderness Neurology: alert and oriented to time, place, and person affect appropriate. normal without focal findings, pleasantly confused mental status, speech normal, alert and oriented to self, PERLA, Motor strength 5/5 and symmetric and sensation grossly normal to light touch  The results of significant diagnostics from this hospitalization (including imaging, microbiology, ancillary and laboratory) are listed below for reference.    Significant Diagnostic Studies: Ct Head Wo Contrast  Result Date: 10/27/2018 CLINICAL DATA:  Found down.  Covid positive. EXAM: CT HEAD WITHOUT CONTRAST CT CERVICAL SPINE WITHOUT CONTRAST TECHNIQUE: Multidetector CT imaging of the head and cervical spine was performed following the standard protocol without intravenous contrast. Multiplanar CT image reconstructions of the cervical spine were also generated. COMPARISON:  Head CT 02/29/2012 FINDINGS: CT HEAD FINDINGS Brain: There is no evidence for acute hemorrhage, hydrocephalus, mass lesion, or abnormal extra-axial fluid collection. No definite CT evidence for acute infarction. Diffuse loss of parenchymal volume is consistent with atrophy. Patchy low attenuation in the deep hemispheric and periventricular white matter is nonspecific, but likely reflects chronic microvascular ischemic demyelination. Vascular: No hyperdense vessel or unexpected  calcification. Skull: No evidence for fracture. No worrisome lytic or sclerotic lesion. Sinuses/Orbits: The visualized paranasal sinuses and mastoid air cells are clear. Visualized portions of the globes and intraorbital fat are unremarkable. Other: None. CT CERVICAL SPINE FINDINGS Alignment: Normal. Skull base and vertebrae: No acute fracture. No primary bone lesion or focal pathologic process. Soft tissues and spinal canal: No prevertebral fluid or swelling. No visible canal hematoma. Disc levels: Loss of disc height with endplate degeneration is seen from C3-4 down to C6-7. Upper chest: Ground-glass opacity noted both lung apices. Other: None. IMPRESSION: 1. No acute intracranial abnormality. 2. Atrophy with chronic small vessel white matter ischemic disease. 3. Diffuse degenerative disc disease in the cervical spine without fracture. 4. Ground-glass attenuation in the lung apices, likely related to the reported history of  COVID-19. Electronically Signed   By: Misty Stanley M.D.   On: 10/27/2018 21:20   Ct Cervical Spine Wo Contrast  Result Date: 10/27/2018 CLINICAL DATA:  Found down.  Covid positive. EXAM: CT HEAD WITHOUT CONTRAST CT CERVICAL SPINE WITHOUT CONTRAST TECHNIQUE: Multidetector CT imaging of the head and cervical spine was performed following the standard protocol without intravenous contrast. Multiplanar CT image reconstructions of the cervical spine were also generated. COMPARISON:  Head CT 02/29/2012 FINDINGS: CT HEAD FINDINGS Brain: There is no evidence for acute hemorrhage, hydrocephalus, mass lesion, or abnormal extra-axial fluid collection. No definite CT evidence for acute infarction. Diffuse loss of parenchymal volume is consistent with atrophy. Patchy low attenuation in the deep hemispheric and periventricular white matter is nonspecific, but likely reflects chronic microvascular ischemic demyelination. Vascular: No hyperdense vessel or unexpected calcification. Skull: No evidence for  fracture. No worrisome lytic or sclerotic lesion. Sinuses/Orbits: The visualized paranasal sinuses and mastoid air cells are clear. Visualized portions of the globes and intraorbital fat are unremarkable. Other: None. CT CERVICAL SPINE FINDINGS Alignment: Normal. Skull base and vertebrae: No acute fracture. No primary bone lesion or focal pathologic process. Soft tissues and spinal canal: No prevertebral fluid or swelling. No visible canal hematoma. Disc levels: Loss of disc height with endplate degeneration is seen from C3-4 down to C6-7. Upper chest: Ground-glass opacity noted both lung apices. Other: None. IMPRESSION: 1. No acute intracranial abnormality. 2. Atrophy with chronic small vessel white matter ischemic disease. 3. Diffuse degenerative disc disease in the cervical spine without fracture. 4. Ground-glass attenuation in the lung apices, likely related to the reported history of COVID-19. Electronically Signed   By: Misty Stanley M.D.   On: 10/27/2018 21:20   Dg Pelvis Portable  Result Date: 10/27/2018 CLINICAL DATA:  Pain status post fall EXAM: PORTABLE PELVIS 1-2 VIEWS COMPARISON:  None. FINDINGS: There is no evidence of pelvic fracture or diastasis. No pelvic bone lesions are seen. IMPRESSION: Negative. Electronically Signed   By: Constance Holster M.D.   On: 10/27/2018 19:40   Dg Chest Port 1 View  Result Date: 11/03/2018 CLINICAL DATA:  Shortness of breath in a patient who is COVID-19 positive. EXAM: PORTABLE CHEST 1 VIEW COMPARISON:  Single-view of the chest 10/27/2018. PA and lateral chest 06/09/2016. FINDINGS: Lungs clear. Heart size normal. Aortic atherosclerosis. No pneumothorax or pleural fluid. No acute or bony abnormality. IMPRESSION: No acute disease. Atherosclerosis. Electronically Signed   By: Inge Rise M.D.   On: 11/03/2018 11:34   Dg Chest Portable 1 View  Result Date: 10/27/2018 CLINICAL DATA:  Pain.  Cough. EXAM: PORTABLE CHEST 1 VIEW COMPARISON:  June 09, 2016  FINDINGS: There are coarse lung markings bilaterally. There is some elevation of the right hemidiaphragm. The heart size is normal. There is no pneumothorax. There are aortic calcifications. There is no acute osseous abnormality. IMPRESSION: 1. No focal infiltrate. 2. Persistent elevation of the right hemidiaphragm. 3. Chronic appearing lung changes bilaterally which may represent emphysema. Electronically Signed   By: Constance Holster M.D.   On: 10/27/2018 19:40    Microbiology: Recent Results (from the past 240 hour(s))  SARS Coronavirus 2 [facility] order, Performed in [facility] hospital lab) Nasopharyngeal Nasopharyngeal Swab     Status: Abnormal   Collection Time: 10/27/18  9:37 PM   Specimen: Nasopharyngeal Swab  Result Value Ref Range Status   SARS Coronavirus 2 POSITIVE (A) NEGATIVE Final    Comment: RESULT CALLED TO, READ BACK BY AND VERIFIED WITH: Sharyon Cable @  2340 ON 10/27/18 BY JUW Performed at [facility], [address] [place], [place] [address]   SARS CORONAVIRUS 2 (TAT 6-12 HRS) Nasal Swab Aptima Multi Swab     Status: None   Collection Time: 11/02/18 12:53 PM   Specimen: Aptima Multi Swab; Nasal Swab  Result Value Ref Range Status   SARS Coronavirus 2 NEGATIVE NEGATIVE Final    Comment: (NOTE) SARS-CoV-2 target nucleic acids are NOT DETECTED. The SARS-CoV-2 RNA is generally detectable in upper and lower respiratory specimens during the acute phase of infection. Negative results do not preclude SARS-CoV-2 infection, do not rule out co-infections with other pathogens, and should not be used as the sole basis for treatment or other patient management decisions. Negative results must be combined with clinical observations, patient history, and epidemiological information. The expected result is Negative. Fact Sheet for Patients: SugarRoll.be Fact Sheet for Healthcare Providers: https://www.woods-mathews.com/ This test is not yet  approved or cleared by the [place] FDA and  has been authorized for detection and/or diagnosis of SARS-CoV-2 by FDA under an Emergency Use Authorization (EUA). This EUA will remain  in effect (meaning this test can be used) for the duration of the COVID-19 declaration under Section 56 4(b)(1) of the Act, 21 U.S.C. section 360bbb-3(b)(1), unless the authorization is terminated or revoked sooner. Performed at [facility], [place] [address], [place], [place] [address]     Labs: CBC: Recent Labs  Lab 10/29/18 [phone] 10/30/18 0416 10/31/18 0439 11/01/18 0413 11/03/18 0736 11/04/18 0815  WBC 10.5 10.3 11.1* 12.8* 15.8* 14.5*  NEUTROABS 6.6 6.7  --   --  11.2* 10.6*  HGB 8.1* 7.4* 8.4* 8.6* 9.1* 9.3*  HCT 24.9* 23.6* 26.5* 27.2* 28.6* 29.5*  MCV 92.9 94.8 95.0 93.2 94.1 94.6  PLT 246 216 257 302 387 0000000*   Basic Metabolic Panel: Recent Labs  Lab 10/29/18 0535 10/30/18 0416 10/31/18 0439 11/03/18 0736  NA 146* 143 140 134*  K 2.9* 3.3* 3.5 4.9  CL 116* 116* 111 101  CO2 21* 21* 23 25  GLUCOSE 97 94 108* 141*  BUN 21 9 5* 16  CREATININE 0.70 0.59 0.62 0.89  CALCIUM 8.6* 8.3* 8.4* 8.7*  MG 1.8 1.8 2.0  --   PHOS 2.5 1.7* 2.1*  --    Liver Function Tests: Recent Labs  Lab 10/29/18 0535 10/30/18 0416 10/31/18 0439 11/03/18 0736  AST  --   --   --  25  ALT  --   --   --  23  ALKPHOS  --   --   --  79  BILITOT  --   --   --  0.8  PROT  --   --   --  6.9  ALBUMIN 2.8* 2.6* 2.8* 2.9*   No results for input(s): LIPASE, AMYLASE in the last 168 hours. No results for input(s): AMMONIA in the last 168 hours. Cardiac Enzymes: No results for input(s): CKTOTAL, CKMB, CKMBINDEX, TROPONINI in the last 168 hours. BNP (last 3 results) No results for input(s): BNP in the last 8760 hours. CBG: No results for input(s): GLUCAP in the last 168 hours. Time spent: 35 minutes  Signed:  Berle Mull  [facility]  11/04/2018

## 2020-10-04 ENCOUNTER — Emergency Department (HOSPITAL_COMMUNITY)
Admission: EM | Admit: 2020-10-04 | Discharge: 2020-10-04 | Disposition: A | Discharge status: Home / Self Care | Payer: Medicare Other | Attending: Emergency Medicine | Admitting: Emergency Medicine

## 2020-10-04 ENCOUNTER — Other Ambulatory Visit: Payer: Self-pay

## 2020-10-04 ENCOUNTER — Emergency Department (HOSPITAL_COMMUNITY): Payer: Medicare Other

## 2020-10-04 DIAGNOSIS — S52501A Unspecified fracture of the lower end of right radius, initial encounter for closed fracture: Secondary | ICD-10-CM | POA: Insufficient documentation

## 2020-10-04 DIAGNOSIS — F0390 Unspecified dementia without behavioral disturbance: Secondary | ICD-10-CM | POA: Diagnosis not present

## 2020-10-04 DIAGNOSIS — W1830XA Fall on same level, unspecified, initial encounter: Secondary | ICD-10-CM | POA: Diagnosis not present

## 2020-10-04 DIAGNOSIS — Z87891 Personal history of nicotine dependence: Secondary | ICD-10-CM | POA: Insufficient documentation

## 2020-10-04 DIAGNOSIS — S52614A Nondisplaced fracture of right ulna styloid process, initial encounter for closed fracture: Secondary | ICD-10-CM | POA: Insufficient documentation

## 2020-10-04 DIAGNOSIS — Y92129 Unspecified place in nursing home as the place of occurrence of the external cause: Secondary | ICD-10-CM | POA: Diagnosis not present

## 2020-10-04 DIAGNOSIS — I10 Essential (primary) hypertension: Secondary | ICD-10-CM | POA: Insufficient documentation

## 2020-10-04 DIAGNOSIS — Z8616 Personal history of COVID-19: Secondary | ICD-10-CM | POA: Diagnosis not present

## 2020-10-04 DIAGNOSIS — M25531 Pain in right wrist: Secondary | ICD-10-CM

## 2020-10-04 DIAGNOSIS — S6991XA Unspecified injury of right wrist, hand and finger(s), initial encounter: Secondary | ICD-10-CM | POA: Diagnosis present

## 2020-10-04 NOTE — ED Triage Notes (Signed)
Pt fell at the facility 3 days ago and she has a fractured right wrist.  The facility just got the radiology report and sent the pt here.

## 2020-10-04 NOTE — ED Notes (Signed)
Called for [facility] to transport back to [facility].

## 2020-10-04 NOTE — Discharge Instructions (Addendum)
Tylenol for pain.  Follow-up with Dr. Aline Brochure in 1 to 2 weeks.

## 2020-10-04 NOTE — ED Provider Notes (Signed)
[facility] EMERGENCY DEPARTMENT Provider Note   CSN: [id number] Arrival date & time: 10/04/20  1336     History Chief Complaint  Patient presents with   Wrist Pain    Kristine Cox is a 80 y.o. female.  Patient injured her right wrist 3 days ago.  Patient has mild swelling and pain right wrist  The history is provided by the patient and a friend. No language interpreter was used.  Wrist Pain This is a new problem. Episode onset: 3 days. The problem occurs constantly. The problem has not changed since onset.Pertinent negatives include no chest pain, no abdominal pain and no headaches. Nothing aggravates the symptoms. Nothing relieves the symptoms. She has tried nothing for the symptoms. The treatment provided no relief.      Past Medical History:  Diagnosis Date   Depression    Family history of anesthesia complication    sister had hard time waking up "   GERD (gastroesophageal reflux disease)    Hyperlipidemia    Hypertension    Mitral valve prolapse     Patient Active Problem List   Diagnosis Date Noted   Hypokalemia 10/29/2018   Acute blood loss anemia 10/28/2018   COVID-19 virus infection 10/28/2018   GI bleed 10/27/2018   Dementia without behavioral disturbance ([place]) 05/19/2016    Past Surgical History:  Procedure Laterality Date   ABDOMINAL HYSTERECTOMY     CHOLECYSTECTOMY     ORIF ANKLE FRACTURE  03/21/2012   ORIF ANKLE FRACTURE  03/21/2012   Procedure: OPEN REDUCTION INTERNAL FIXATION (ORIF) ANKLE FRACTURE;  Surgeon: Meredith Pel, MD;  Location: [facility];  Service: Orthopedics;  Laterality: Left;  Open Reduction Internal Fixation left ankle fracture   TONSILLECTOMY       OB History   No obstetric history on file.     Family History  Problem Relation Age of Onset   Melanoma Mother    Stroke Father    Heart disease Father    Cancer Sister    Alcohol abuse Sister    Sexual abuse Sister    Cancer Brother    Cancer Sister    Sexual abuse  Sister    Alcohol abuse Maternal Aunt    Alcohol abuse Paternal Aunt    Depression Maternal Grandfather     Social History   Tobacco Use   Smoking status: Former    Types: Cigarettes    Quit date: 03/21/1966    Years since quitting: 54.5   Smokeless tobacco: Never  Vaping Use   Vaping Use: Never used  Substance Use Topics   Alcohol use: No   Drug use: No    Home Medications Prior to Admission medications   Medication Sig Start Date End Date Taking? Authorizing Provider  divalproex (DEPAKOTE) 250 MG DR tablet Take 250 mg by mouth 2 (two) times daily. 07/02/16 07/02/17  [provider]  docusate sodium (COLACE) 100 MG capsule Take 100 mg by mouth daily.    [provider]  donepezil (ARICEPT) 10 MG tablet Take 10 mg by mouth at bedtime.    [provider]  ferrous Q000111Q C-folic acid (TRINSICON / FOLTRIN) capsule Take 1 capsule by mouth daily. 11/05/18   Lavina Hamman, MD  FLUoxetine (PROZAC) 20 MG capsule Take 2 tablets daily 05/26/16   Cameron Sprang, MD  memantine (NAMENDA) 10 MG tablet Take 10 mg by mouth 2 (two) times daily.    [provider]  nabumetone (RELAFEN) 750 MG  tablet Take 750 mg by mouth daily.    [provider]  Omega-3 Fatty Acids (FISH OIL) 1000 MG CAPS Take 1 capsule by mouth 2 (two) times daily.    [provider]  pantoprazole (PROTONIX) 40 MG tablet Take 1 tablet (40 mg total) by mouth 2 (two) times daily before a meal. 11/04/18   Lavina Hamman, MD  potassium chloride SA (K-DUR) 20 MEQ tablet Take 1 tablet (20 mEq total) by mouth 2 (two) times daily. 11/04/18   Lavina Hamman, MD  pravastatin (PRAVACHOL) 40 MG tablet Take 40 mg by mouth daily.    [provider]  QUEtiapine (SEROQUEL) 25 MG tablet Take 1 tablet (25 mg total) by mouth 2 (two) times daily. Patient taking differently: Take 25 mg by mouth at bedtime.  11/12/16 10/27/18  Plovsky, Berneta Sages, MD  Vitamin D, Cholecalciferol, 25  MCG (1000 UT) TABS Take 2 tablets by mouth daily.    [provider]    Allergies    Succinylcholine  Review of Systems   Review of Systems  Constitutional:  Negative for appetite change and fatigue.  HENT:  Negative for congestion, ear discharge and sinus pressure.   Eyes:  Negative for discharge.  Respiratory:  Negative for cough.   Cardiovascular:  Negative for chest pain.  Gastrointestinal:  Negative for abdominal pain and diarrhea.  Genitourinary:  Negative for frequency and hematuria.  Musculoskeletal:  Negative for back pain.       Mild swelling tenderness to right distal wrist  Skin:  Negative for rash.  Neurological:  Negative for seizures and headaches.  Psychiatric/Behavioral:  Negative for hallucinations.    Physical Exam Updated Vital Signs BP 116/62 (BP Location: Left Arm)   Pulse 63   Temp 97.9 F (36.6 C) (Oral)   Resp 18   SpO2 100%   Physical Exam Vitals and nursing note reviewed.  Constitutional:      Appearance: She is well-developed.  HENT:     Head: Normocephalic.     Nose: Nose normal.  Eyes:     Conjunctiva/sclera: Conjunctivae normal.  Neck:     Trachea: No tracheal deviation.  Cardiovascular:     Rate and Rhythm: Normal rate.  Pulmonary:     Effort: Pulmonary effort is normal.  Musculoskeletal:     Comments: Swelling tenderness to distal right wrist normal pulse  Skin:    General: Skin is warm.  Neurological:     Mental Status: She is oriented to person, place, and time.    ED Results / Procedures / Treatments   Labs (all labs ordered are listed, but only abnormal results are displayed) Labs Reviewed - No data to display  EKG None  Radiology No results found.  Procedures Procedures   Medications Ordered in ED Medications - No data to display  ED Course  I have reviewed the triage vital signs and the nursing notes.  Pertinent labs & imaging results that were available during my care of the patient were reviewed  by me and considered in my medical decision making (see chart for details).    MDM Rules/Calculators/A&P                           Patient with close minimal fracture of the distal right radius.  She is given a Velcro wrist splint and will follow up with orthopedics Final Clinical Impression(s) / ED Diagnoses Final diagnoses:  Right wrist pain  Rx / DC Orders ED Discharge Orders     None        Milton Ferguson, MD 10/06/20 1015

## 2020-10-07 ENCOUNTER — Telehealth: Payer: Self-pay

## 2020-10-07 ENCOUNTER — Ambulatory Visit (INDEPENDENT_AMBULATORY_CARE_PROVIDER_SITE_OTHER): Payer: Medicare Other | Admitting: Family

## 2020-10-07 ENCOUNTER — Encounter: Payer: Self-pay | Admitting: Family

## 2020-10-07 DIAGNOSIS — S52551A Other extraarticular fracture of lower end of right radius, initial encounter for closed fracture: Secondary | ICD-10-CM | POA: Diagnosis not present

## 2020-10-07 MED ORDER — ACETAMINOPHEN-CODEINE #3 300-30 MG PO TABS: 1.0000 | ORAL_TABLET | ORAL | 0 refills | Status: AC | PRN

## 2020-10-07 NOTE — Addendum Note (Signed)
Addended by: Dondra Prader R on: 10/07/2020 10:25 AM   Modules accepted: Orders

## 2020-10-07 NOTE — Telephone Encounter (Signed)
Called wanted to let Junie Panning know that pt removed her cast and they are just going to cancel upcoming appts. Pt will not keep anything on

## 2020-10-07 NOTE — Progress Notes (Signed)
Office Visit Note   Patient: Kristine Cox           Date of Birth: [birth year]           MRN: [id number] Visit Date: 10/07/2020              Requested by: Haywood Pao, MD [address] [place],  [place] [address] PCP: Osborne Casco Fransico Him, MD  No chief complaint on file.     HPI: The patient is an 80 year old woman seen today for initial evaluation of a right wrist injury.  Had a fall on outstretched hand on July 27.  Was taken to the emergency department about 3 days later initial radiographs were performed but she was placed in Velcro wrist splint.  Patient resides at [facility]  Staff accompanies her today they report she has been noncompliant with her Velcro wrist splint.  Assessment & Plan: Visit Diagnoses:  1. Other closed extra-articular fracture of distal end of right radius, initial encounter     Plan: We will place her in a short arm cast today she will come in in 2 weeks for repeat radiographs.  Follow-Up Instructions: No follow-ups on file.   Ortho Exam  Patient is alert, oriented, no adenopathy, well-dressed, normal affect, normal respiratory effort. Moving the hand and wrist freely does endorse pain on exam there is swelling ecchymosis.  There is a strong radial pulse she is neurovascularly intact  Imaging: No results found. No images are attached to the encounter.  IMPRESSION: Acute mildly impacted fracture of the metaphysis of the distal radius. No disruption of the articular surface.   Nondisplaced fracture of the ulnar styloid.  Labs: Lab Results  Component Value Date   CRP 11.6 (H) 11/04/2018   CRP 15.2 (H) 11/03/2018   CRP <0.8 10/30/2018   REPTSTATUS 03/22/2012 FINAL 03/20/2012   CULT KLEBSIELLA PNEUMONIAE 03/20/2012   LABORGA KLEBSIELLA PNEUMONIAE 03/20/2012     Lab Results  Component Value Date   ALBUMIN 2.9 (L) 11/03/2018   ALBUMIN 2.8 (L) 10/31/2018   ALBUMIN 2.6 (L) 10/30/2018    Lab Results  Component Value Date    MG 2.0 10/31/2018   MG 1.8 10/30/2018   MG 1.8 10/29/2018   No results found for: VD25OH  No results found for: PREALBUMIN CBC EXTENDED Latest Ref Rng & Units 11/04/2018 11/03/2018 11/01/2018  WBC 4.0 - 10.5 K/uL 14.5(H) 15.8(H) 12.8(H)  RBC 3.87 - 5.11 MIL/uL 3.12(L) 3.04(L) 2.92(L)  HGB 12.0 - 15.0 g/dL 9.3(L) 9.1(L) 8.6(L)  HCT 36.0 - 46.0 % 29.5(L) 28.6(L) 27.2(L)  PLT 150 - 400 K/uL 476(H) 387 302  NEUTROABS 1.7 - 7.7 K/uL 10.6(H) 11.2(H) -  LYMPHSABS 0.7 - 4.0 K/uL 1.6 1.8 -     There is no height or weight on file to calculate BMI.  Orders:  No orders of the defined types were placed in this encounter.  No orders of the defined types were placed in this encounter.    Procedures: No procedures performed  Clinical Data: No additional findings.  ROS:  All other systems negative, except as noted in the HPI. Review of Systems  Objective: Vital Signs: There were no vitals taken for this visit.  Specialty Comments:  No specialty comments available.  PMFS History: Patient Active Problem List   Diagnosis Date Noted   Hypokalemia 10/29/2018   Acute blood loss anemia 10/28/2018   COVID-19 virus infection 10/28/2018   GI bleed 10/27/2018   Dementia without behavioral disturbance (  [place]) 05/19/2016   Past Medical History:  Diagnosis Date   Depression    Family history of anesthesia complication    sister had hard time waking up "   GERD (gastroesophageal reflux disease)    Hyperlipidemia    Hypertension    Mitral valve prolapse     Family History  Problem Relation Age of Onset   Melanoma Mother    Stroke Father    Heart disease Father    Cancer Sister    Alcohol abuse Sister    Sexual abuse Sister    Cancer Brother    Cancer Sister    Sexual abuse Sister    Alcohol abuse Maternal Aunt    Alcohol abuse Paternal Aunt    Depression Maternal Grandfather     Past Surgical History:  Procedure Laterality Date   ABDOMINAL HYSTERECTOMY     CHOLECYSTECTOMY      ORIF ANKLE FRACTURE  03/21/2012   ORIF ANKLE FRACTURE  03/21/2012   Procedure: OPEN REDUCTION INTERNAL FIXATION (ORIF) ANKLE FRACTURE;  Surgeon: Meredith Pel, MD;  Location: [place];  Service: Orthopedics;  Laterality: Left;  Open Reduction Internal Fixation left ankle fracture   TONSILLECTOMY     Social History   Occupational History   Not on file  Tobacco Use   Smoking status: Former    Types: Cigarettes    Quit date: 03/21/1966    Years since quitting: 54.5   Smokeless tobacco: Never  Vaping Use   Vaping Use: Never used  Substance and Sexual Activity   Alcohol use: No   Drug use: No   Sexual activity: Not Currently

## 2020-10-21 ENCOUNTER — Ambulatory Visit: Payer: Medicare Other | Admitting: Family

## 2020-10-31 ENCOUNTER — Ambulatory Visit: Payer: Self-pay

## 2020-10-31 ENCOUNTER — Encounter: Payer: Self-pay | Admitting: Family

## 2020-10-31 ENCOUNTER — Ambulatory Visit (INDEPENDENT_AMBULATORY_CARE_PROVIDER_SITE_OTHER): Payer: Medicare Other | Admitting: Family

## 2020-10-31 DIAGNOSIS — S52551A Other extraarticular fracture of lower end of right radius, initial encounter for closed fracture: Secondary | ICD-10-CM | POA: Diagnosis not present

## 2020-10-31 NOTE — Progress Notes (Signed)
Office Visit Note   Patient: Kristine Cox           Date of Birth: 25-Aug-1940           MRN: [id number] Visit Date: 10/31/2020              Requested by: Haywood Pao, MD [address] [place],  [place] [address] PCP: Haywood Pao, MD  Chief Complaint  Patient presents with   Right Wrist - Follow-up    DOI 10/01/20       HPI: The patient is an 80 year old woman seen today in follow up right wrist injury.  Had a fall on outstretched hand on July 27.    Patient resides at [facility]  Staff accompanies her today they report she has been noncompliant with her short arm cast. Removed it the date it was applied.  The patient has been using the wrist for weightbearing range of motion she does wince in pain when she uses the wrist however she has been noncompliant with splinting or nonweightbearing  Assessment & Plan: Visit Diagnoses:  1. Other closed extra-articular fracture of distal end of right radius, initial encounter     Plan: Do recommend that she continue with the short arm wrist splint as well as nonweightbearing of the wrist for another 2 weeks however understand the patient due to her dementia it is unlikely will be compliant with this.  If she fails to progress as expected we will see her back in 3 to 4 weeks  Follow-Up Instructions: No follow-ups on file.   Ortho Exam  Patient is alert, oriented, no adenopathy, well-dressed, normal affect, normal respiratory effort. Moving the hand and wrist freely.  Full range of motion.  Does endorse tenderness over the distal radius as well as distal ulna.  There is a strong radial pulse she is neurovascularly intact.  Imaging: No results found. No images are attached to the encounter.  IMPRESSION: Acute mildly impacted fracture of the metaphysis of the distal radius. No disruption of the articular surface.   Nondisplaced fracture of the ulnar styloid.  Labs: Lab Results  Component Value Date   CRP 11.6  (H) 11/04/2018   CRP 15.2 (H) 11/03/2018   CRP <0.8 10/30/2018   REPTSTATUS 03/22/2012 FINAL 03/20/2012   CULT KLEBSIELLA PNEUMONIAE 03/20/2012   LABORGA KLEBSIELLA PNEUMONIAE 03/20/2012     Lab Results  Component Value Date   ALBUMIN 2.9 (L) 11/03/2018   ALBUMIN 2.8 (L) 10/31/2018   ALBUMIN 2.6 (L) 10/30/2018    Lab Results  Component Value Date   MG 2.0 10/31/2018   MG 1.8 10/30/2018   MG 1.8 10/29/2018   No results found for: VD25OH  No results found for: PREALBUMIN CBC EXTENDED Latest Ref Rng & Units 11/04/2018 11/03/2018 11/01/2018  WBC 4.0 - 10.5 K/uL 14.5(H) 15.8(H) 12.8(H)  RBC 3.87 - 5.11 MIL/uL 3.12(L) 3.04(L) 2.92(L)  HGB 12.0 - 15.0 g/dL 9.3(L) 9.1(L) 8.6(L)  HCT 36.0 - 46.0 % 29.5(L) 28.6(L) 27.2(L)  PLT 150 - 400 K/uL 476(H) 387 302  NEUTROABS 1.7 - 7.7 K/uL 10.6(H) 11.2(H) -  LYMPHSABS 0.7 - 4.0 K/uL 1.6 1.8 -     There is no height or weight on file to calculate BMI.  Orders:  Orders Placed This Encounter  Procedures   XR Wrist 2 Views Right    No orders of the defined types were placed in this encounter.    Procedures: No procedures performed  Clinical Data:  No additional findings.  ROS:  All other systems negative, except as noted in the HPI. Review of Systems  Objective: Vital Signs: There were no vitals taken for this visit.  Specialty Comments:  No specialty comments available.  PMFS History: Patient Active Problem List   Diagnosis Date Noted   Hypokalemia 10/29/2018   Acute blood loss anemia 10/28/2018   COVID-19 virus infection 10/28/2018   GI bleed 10/27/2018   Dementia without behavioral disturbance ([place]) 05/19/2016   Past Medical History:  Diagnosis Date   Depression    Family history of anesthesia complication    sister had hard time waking up "   GERD (gastroesophageal reflux disease)    Hyperlipidemia    Hypertension    Mitral valve prolapse     Family History  Problem Relation Age of Onset   Melanoma  Mother    Stroke Father    Heart disease Father    Cancer Sister    Alcohol abuse Sister    Sexual abuse Sister    Cancer Brother    Cancer Sister    Sexual abuse Sister    Alcohol abuse Maternal Aunt    Alcohol abuse Paternal Aunt    Depression Maternal Grandfather     Past Surgical History:  Procedure Laterality Date   ABDOMINAL HYSTERECTOMY     CHOLECYSTECTOMY     ORIF ANKLE FRACTURE  03/21/2012   ORIF ANKLE FRACTURE  03/21/2012   Procedure: OPEN REDUCTION INTERNAL FIXATION (ORIF) ANKLE FRACTURE;  Surgeon: Meredith Pel, MD;  Location: [facility];  Service: Orthopedics;  Laterality: Left;  Open Reduction Internal Fixation left ankle fracture   TONSILLECTOMY     Social History   Occupational History   Not on file  Tobacco Use   Smoking status: Former    Types: Cigarettes    Quit date: 03/21/1966    Years since quitting: 54.6   Smokeless tobacco: Never  Vaping Use   Vaping Use: Never used  Substance and Sexual Activity   Alcohol use: No   Drug use: No   Sexual activity: Not Currently

## 2022-02-13 ENCOUNTER — Emergency Department (HOSPITAL_COMMUNITY): Payer: Medicare Other

## 2022-02-13 ENCOUNTER — Emergency Department (HOSPITAL_COMMUNITY)
Admission: EM | Admit: 2022-02-13 | Discharge: 2022-02-13 | Disposition: A | Discharge status: Skilled Nursing Facility | Payer: Medicare Other | Attending: Emergency Medicine | Admitting: Emergency Medicine

## 2022-02-13 DIAGNOSIS — I10 Essential (primary) hypertension: Secondary | ICD-10-CM | POA: Diagnosis not present

## 2022-02-13 DIAGNOSIS — M25531 Pain in right wrist: Secondary | ICD-10-CM | POA: Diagnosis not present

## 2022-02-13 DIAGNOSIS — S52241A Displaced spiral fracture of shaft of ulna, right arm, initial encounter for closed fracture: Secondary | ICD-10-CM | POA: Insufficient documentation

## 2022-02-13 DIAGNOSIS — Y92129 Unspecified place in nursing home as the place of occurrence of the external cause: Secondary | ICD-10-CM | POA: Insufficient documentation

## 2022-02-13 DIAGNOSIS — X58XXXA Exposure to other specified factors, initial encounter: Secondary | ICD-10-CM | POA: Insufficient documentation

## 2022-02-13 DIAGNOSIS — S4991XA Unspecified injury of right shoulder and upper arm, initial encounter: Secondary | ICD-10-CM | POA: Diagnosis present

## 2022-02-13 DIAGNOSIS — F0390 Unspecified dementia without behavioral disturbance: Secondary | ICD-10-CM | POA: Insufficient documentation

## 2022-02-13 NOTE — ED Notes (Signed)
Pt's daughter updated on pt plan of care and discharge.

## 2022-02-13 NOTE — ED Notes (Signed)
Called  [facility] for transport back to [place], they have placed pt on the list at this time.

## 2022-02-13 NOTE — ED Notes (Signed)
Pt stood at bedside without assistance. Pt did not appear to be in any distress.

## 2022-02-13 NOTE — ED Provider Notes (Signed)
[facility] EMERGENCY DEPARTMENT Provider Note   CSN: [id number] Arrival date & time: 02/13/22  1149     History  Chief Complaint  Patient presents with   Arm Pain    Kristine Cox is a 81 y.o. female.  Pt is a 81 yo female with a pmhx significant for dementia, htn, depression, hld, and gerd.  Pt had an xray yesterday which showed a distal ulnar fx by report.  Per EMS report, the SNF tried to put on a wrist brace, but she would not keep it on.  They sent her in for further eval.  Pt is unable to give any hx due to dementia.         Home Medications Prior to Admission medications   Medication Sig Start Date End Date Taking? Authorizing Provider  acetaminophen-codeine (TYLENOL #3) 300-30 MG tablet Take 1 tablet by mouth every 4 (four) hours as needed for moderate pain. 10/07/20   Suzan Slick, NP  divalproex (DEPAKOTE) 250 MG DR tablet Take 250 mg by mouth 2 (two) times daily. 07/02/16 07/02/17  [provider]  docusate sodium (COLACE) 100 MG capsule Take 100 mg by mouth daily.    [provider]  donepezil (ARICEPT) 10 MG tablet Take 10 mg by mouth at bedtime.    [provider]  ferrous [id number] C-folic acid (TRINSICON / FOLTRIN) capsule Take 1 capsule by mouth daily. 11/05/18   Lavina Hamman, MD  FLUoxetine (PROZAC) 20 MG capsule Take 2 tablets daily 05/26/16   Cameron Sprang, MD  memantine (NAMENDA) 10 MG tablet Take 10 mg by mouth 2 (two) times daily.    [provider]  nabumetone (RELAFEN) 750 MG tablet Take 750 mg by mouth daily.    [provider]  Omega-3 Fatty Acids (FISH OIL) 1000 MG CAPS Take 1 capsule by mouth 2 (two) times daily.    [provider]  pantoprazole (PROTONIX) 40 MG tablet Take 1 tablet (40 mg total) by mouth 2 (two) times daily before a meal. 11/04/18   Lavina Hamman, MD  potassium chloride SA (K-DUR) 20 MEQ tablet Take 1 tablet (20 mEq total) by mouth 2 (two) times daily. 11/04/18    Lavina Hamman, MD  pravastatin (PRAVACHOL) 40 MG tablet Take 40 mg by mouth daily.    [provider]  QUEtiapine (SEROQUEL) 25 MG tablet Take 1 tablet (25 mg total) by mouth 2 (two) times daily. Patient taking differently: Take 25 mg by mouth at bedtime.  11/12/16 10/27/18  Plovsky, Berneta Sages, MD  Vitamin D, Cholecalciferol, 25 MCG (1000 UT) TABS Take 2 tablets by mouth daily.    [provider]      Allergies    Succinylcholine    Review of Systems   Review of Systems  Unable to perform ROS: Dementia  All other systems reviewed and are negative.   Physical Exam Updated Vital Signs BP (!) 101/47   Pulse 63   Temp (!) 97.5 F (36.4 C)   Resp 16   SpO2 92%  Physical Exam Vitals and nursing note reviewed.  Constitutional:      Appearance: Normal appearance.  HENT:     Head: Normocephalic and atraumatic.     Right Ear: External ear normal.     Left Ear: External ear normal.     Nose: Nose normal.     Mouth/Throat:     Mouth: Mucous membranes are moist.     Pharynx: Oropharynx is clear.  Eyes:     Extraocular Movements: Extraocular movements intact.     Conjunctiva/sclera: Conjunctivae normal.     Pupils: Pupils are equal, round, and reactive to light.  Cardiovascular:     Rate and Rhythm: Normal rate and regular rhythm.     Pulses: Normal pulses.     Heart sounds: Normal heart sounds.  Pulmonary:     Effort: Pulmonary effort is normal.     Breath sounds: Normal breath sounds.  Abdominal:     General: Abdomen is flat. Bowel sounds are normal.     Palpations: Abdomen is soft.  Musculoskeletal:     Cervical back: Normal range of motion and neck supple.     Comments: Right forearm with yellow/green bruising.  No new bruises noted.  No pain when any other part of her is palpated.  Skin:    General: Skin is warm.     Capillary Refill: Capillary refill takes less than 2 seconds.  Neurological:     General: No focal deficit present.     Mental Status:  She is alert. Mental status is at baseline.  Psychiatric:        Mood and Affect: Mood normal.     ED Results / Procedures / Treatments   Labs (all labs ordered are listed, but only abnormal results are displayed) Labs Reviewed - No data to display  EKG None  Radiology DG Wrist Complete Right  Result Date: 02/13/2022 CLINICAL DATA:  Right wrist pain, previous fracture EXAM: RIGHT WRIST - COMPLETE 3+ VIEW COMPARISON:  None Available. FINDINGS: Ulnar shaft spiral fracture better characterized on the dedicated forearm radiographs. Deformity of the distal radius compatible with a healed distal radial metaphyseal fracture. Faint sclerosis along the proximal pole of the lunate adjacent to the ulna with mild positive ulnar variance, new from 10/31/2020, raising suspicion for ulnolunate abutment. IMPRESSION: 1. Ulnar shaft spiral fracture better characterized on the dedicated forearm radiographs. 2. Deformity of the distal radius compatible with a healed distal radial metaphyseal fracture. 3. Mild positive ulnar variance with faint sclerosis along the proximal pole of the lunate raising suspicion for ulnolunate abutment. Electronically Signed   By: Van Clines M.D.   On: 02/13/2022 12:46   DG Forearm Right  Result Date: 02/13/2022 CLINICAL DATA:  Prior fracture, patient refuses to wear wrist brace. EXAM: RIGHT FOREARM - 2 VIEW COMPARISON:  Radiographs 10/31/2020 FINDINGS: Deformity from prior healed distal radial fracture. Spiral fracture of the distal ulnar diaphysis. Faint indistinctness of fracture plane may indicate very early healing response. 3 mm medial displacement of the distal fracture fragment with respect to the proximal. No definite additional forearm fracture is observed. IMPRESSION: 1. Spiral fracture of the distal ulnar diaphysis with 3 mm medial displacement of the distal fracture fragment. Very early healing response noted. 2. Old healed distal radial fracture. Electronically  Signed   By: Van Clines M.D.   On: 02/13/2022 12:44    Procedures .Ortho Injury Treatment  Date/Time: 02/13/2022 1:39 PM  Performed by: Isla Pence, MD Authorized by: Isla Pence, MD   Consent:    Consent obtained:  Emergent situationInjury location: forearm Location details: right forearm Injury type: fracture Fracture type: ulnar shaft Pre-procedure neurovascular assessment: neurovascularly intact Pre-procedure distal perfusion: normal Pre-procedure neurological function: normal Pre-procedure range of motion: reduced  Anesthesia: Local anesthesia used: no  Patient sedated: NoManipulation performed: no Immobilization: splint Splint type: sugar tong Splint Applied by: ED Nurse and ED Provider Supplies used: cotton padding, elastic bandage and  Ortho-Glass Post-procedure neurovascular assessment: post-procedure neurovascularly intact Post-procedure distal perfusion: normal Post-procedure neurological function: normal       Medications Ordered in ED Medications - No data to display  ED Course/ Medical Decision Making/ A&P                           Medical Decision Making Amount and/or Complexity of Data Reviewed Radiology: ordered.   This patient presents to the ED for concern of fall, this involves an extensive number of treatment options, and is a complaint that carries with it a high risk of complications and morbidity.  The differential diagnosis includes multiple trauma   Co morbidities that complicate the patient evaluation  dementia, htn, depression, hld, and gerd   Additional history obtained:  Additional history obtained from epic chart review External records from outside source obtained and reviewed including EMS report    Imaging Studies ordered:  I ordered imaging studies including right forearm/right wrist  I independently visualized and interpreted imaging which showed  R wrist: 1. Ulnar shaft spiral fracture better  characterized on the dedicated  forearm radiographs.  2. Deformity of the distal radius compatible with a healed distal  radial metaphyseal fracture.  3. Mild positive ulnar variance with faint sclerosis along the  proximal pole of the lunate raising suspicion for ulnolunate  abutment.   R forearm:  Spiral fracture of the distal ulnar diaphysis with 3 mm medial  displacement of the distal fracture fragment. Very early healing  response noted.  2. Old healed distal radial fracture.   I agree with the radiologist interpretation   Cardiac Monitoring:  The patient was maintained on a cardiac monitor.  I personally viewed and interpreted the cardiac monitored which showed an underlying rhythm of: nsr   Medicines ordered and prescription drug management:  I ordered medication including tylenol  for pain  Reevaluation of the patient after these medicines showed that the patient improved I have reviewed the patients home medicines and have made adjustments as needed  Critical Interventions:  splint   Problem List / ED Course:  Right ulnar shaft fx:  pt placed in a sugar tong splint.  Pt does not seem to have any other injuries.  Trauma to arm happened several days ago, so I don't think there is any need for ct scans, etc.  Pt d/w sister who is poa.  Pt will need to f/u with ortho.  Pt did see NP Zamora with ortho care in August for a distal radius fx.  Pt is stable for d/c.  Return if worse.    Reevaluation:  After the interventions noted above, I reevaluated the patient and found that they have :improved   Social Determinants of Health:  Lives in SNF   Dispostion:  After consideration of the diagnostic results and the patients response to treatment, I feel that the patent would benefit from discharge with outpatient f/u.          Final Clinical Impression(s) / ED Diagnoses Final diagnoses:  Closed displaced spiral fracture of shaft of right ulna, initial encounter     Rx / DC Orders ED Discharge Orders     None         Isla Pence, MD 02/13/22 1342

## 2022-02-13 NOTE — ED Triage Notes (Signed)
Pt arrives via [facility] [employer] [facility] from [facility] for evaluation of previous fx. Reportedly pt refuses to wear her wrist brace. No new injury/fall reported to EMS from staff. Pt has healing bruise and swelling to area. Alert only to person on arrival.

## 2022-03-03 ENCOUNTER — Ambulatory Visit (INDEPENDENT_AMBULATORY_CARE_PROVIDER_SITE_OTHER): Payer: Medicare Other | Admitting: Orthopaedic Surgery

## 2022-03-03 ENCOUNTER — Ambulatory Visit: Payer: Medicare Other | Admitting: Family

## 2022-03-03 ENCOUNTER — Ambulatory Visit (INDEPENDENT_AMBULATORY_CARE_PROVIDER_SITE_OTHER): Payer: Medicare Other

## 2022-03-03 ENCOUNTER — Encounter: Payer: Self-pay | Admitting: Orthopaedic Surgery

## 2022-03-03 DIAGNOSIS — M25531 Pain in right wrist: Secondary | ICD-10-CM

## 2022-03-03 DIAGNOSIS — S52234A Nondisplaced oblique fracture of shaft of right ulna, initial encounter for closed fracture: Secondary | ICD-10-CM | POA: Diagnosis not present

## 2022-03-03 NOTE — Progress Notes (Signed)
The patient is a very pleasant 81 year old female who is accompanied by her daughter today as a referral from the emergency room for a right ulna shaft fracture.  This injury occurred almost 4 weeks ago.  She does stay in nursing care facility and has significant dementia.  She has been removing her splint.  She does have a history of a wrist fracture on the right side earlier this year after mechanical fall.  She is in a splint and a sling today.  I did remove the splint and the sling but after x-rays were obtained.  The x-rays from the time of injury and today show interval healing of a nondisplaced ulnar shaft fracture.  The wrist fracture is completely healed as far as the distal radius fracture.  I recommended her be out of any splint at this point given the fact that she would remove anything with her dementia.  She does have some pain over the fracture site itself but it seems to be minimal.  There is no soft tissue compromise and there is only some bruising.  She is able to move her elbow and wrist pretty easily.  I did give notes to the nursing care facility that reflects that she can use her arm as comfort allows.  Her daughter agrees with our treatment plan to leave her out of any splint at this point given her dementia and the need for hygiene purposes.  Follow-up can be as needed.  All questions and concerns were answered and addressed.

## 2022-07-11 ENCOUNTER — Encounter (HOSPITAL_COMMUNITY): Payer: Self-pay

## 2022-07-11 ENCOUNTER — Emergency Department (HOSPITAL_COMMUNITY): Payer: Medicare Other

## 2022-07-11 ENCOUNTER — Other Ambulatory Visit: Payer: Self-pay

## 2022-07-11 ENCOUNTER — Emergency Department (HOSPITAL_COMMUNITY)
Admission: EM | Admit: 2022-07-11 | Discharge: 2022-07-11 | Disposition: A | Discharge status: Home / Self Care | Payer: Medicare Other | Attending: Emergency Medicine | Admitting: Emergency Medicine

## 2022-07-11 DIAGNOSIS — F0390 Unspecified dementia without behavioral disturbance: Secondary | ICD-10-CM | POA: Diagnosis not present

## 2022-07-11 DIAGNOSIS — I700 Atherosclerosis of aorta: Secondary | ICD-10-CM | POA: Insufficient documentation

## 2022-07-11 DIAGNOSIS — Z1152 Encounter for screening for COVID-19: Secondary | ICD-10-CM | POA: Diagnosis not present

## 2022-07-11 DIAGNOSIS — W19XXXA Unspecified fall, initial encounter: Secondary | ICD-10-CM | POA: Diagnosis not present

## 2022-07-11 DIAGNOSIS — J849 Interstitial pulmonary disease, unspecified: Secondary | ICD-10-CM | POA: Insufficient documentation

## 2022-07-11 DIAGNOSIS — S0181XA Laceration without foreign body of other part of head, initial encounter: Secondary | ICD-10-CM | POA: Diagnosis not present

## 2022-07-11 DIAGNOSIS — N289 Disorder of kidney and ureter, unspecified: Secondary | ICD-10-CM | POA: Diagnosis not present

## 2022-07-11 DIAGNOSIS — I2510 Atherosclerotic heart disease of native coronary artery without angina pectoris: Secondary | ICD-10-CM | POA: Insufficient documentation

## 2022-07-11 DIAGNOSIS — I10 Essential (primary) hypertension: Secondary | ICD-10-CM | POA: Insufficient documentation

## 2022-07-11 DIAGNOSIS — S0990XA Unspecified injury of head, initial encounter: Secondary | ICD-10-CM | POA: Diagnosis present

## 2022-07-11 HISTORY — DX: Unspecified dementia, unspecified severity, without behavioral disturbance, psychotic disturbance, mood disturbance, and anxiety: F03.90

## 2022-07-11 LAB — URINALYSIS, W/ REFLEX TO CULTURE (INFECTION SUSPECTED)
Bilirubin Urine: NEGATIVE
Glucose, UA: NEGATIVE mg/dL
Hgb urine dipstick: NEGATIVE
Ketones, ur: NEGATIVE mg/dL
Leukocytes,Ua: NEGATIVE
Nitrite: NEGATIVE
Protein, ur: NEGATIVE mg/dL
Specific Gravity, Urine: 1.01 (ref 1.005–1.030)
pH: 7 (ref 5.0–8.0)

## 2022-07-11 LAB — CBC WITH DIFFERENTIAL/PLATELET
Abs Immature Granulocytes: 0.29 10*3/uL — ABNORMAL HIGH (ref 0.00–0.07)
Basophils Absolute: 0.1 10*3/uL (ref 0.0–0.1)
Basophils Relative: 0 %
Eosinophils Absolute: 0.1 10*3/uL (ref 0.0–0.5)
Eosinophils Relative: 0 %
HCT: 41.7 % (ref 36.0–46.0)
Hemoglobin: 13.6 g/dL (ref 12.0–15.0)
Immature Granulocytes: 2 %
Lymphocytes Relative: 10 %
Lymphs Abs: 1.9 10*3/uL (ref 0.7–4.0)
MCH: 29.8 pg (ref 26.0–34.0)
MCHC: 32.6 g/dL (ref 30.0–36.0)
MCV: 91.2 fL (ref 80.0–100.0)
Monocytes Absolute: 1 10*3/uL (ref 0.1–1.0)
Monocytes Relative: 5 %
Neutro Abs: 16 10*3/uL — ABNORMAL HIGH (ref 1.7–7.7)
Neutrophils Relative %: 83 %
Platelets: 218 10*3/uL (ref 150–400)
RBC: 4.57 MIL/uL (ref 3.87–5.11)
RDW: 13.4 % (ref 11.5–15.5)
WBC: 19.4 10*3/uL — ABNORMAL HIGH (ref 4.0–10.5)
nRBC: 0 % (ref 0.0–0.2)

## 2022-07-11 LAB — CBG MONITORING, ED: Glucose-Capillary: 126 mg/dL — ABNORMAL HIGH (ref 70–99)

## 2022-07-11 LAB — COMPREHENSIVE METABOLIC PANEL
ALT: 24 U/L (ref 0–44)
AST: 30 U/L (ref 15–41)
Albumin: 3.6 g/dL (ref 3.5–5.0)
Alkaline Phosphatase: 67 U/L (ref 38–126)
Anion gap: 10 (ref 5–15)
BUN: 14 mg/dL (ref 8–23)
CO2: 25 mmol/L (ref 22–32)
Calcium: 9 mg/dL (ref 8.9–10.3)
Chloride: 102 mmol/L (ref 98–111)
Creatinine, Ser: 0.71 mg/dL (ref 0.44–1.00)
GFR, Estimated: >60 mL/min (ref >60)
Glucose, Bld: 123 mg/dL — ABNORMAL HIGH (ref 70–99)
Potassium: 4.1 mmol/L (ref 3.5–5.1)
Sodium: 137 mmol/L (ref 135–145)
Total Bilirubin: 0.8 mg/dL (ref 0.3–1.2)
Total Protein: 6.9 g/dL (ref 6.5–8.1)

## 2022-07-11 LAB — PROTIME-INR
INR: 1.1 (ref 0.8–1.2)
Prothrombin Time: 14 seconds (ref 11.4–15.2)

## 2022-07-11 LAB — SARS CORONAVIRUS 2 BY RT PCR: SARS Coronavirus 2 by RT PCR: NEGATIVE

## 2022-07-11 LAB — LACTIC ACID, PLASMA
Lactic Acid, Venous: 3.4 mmol/L (ref 0.5–1.9)
Lactic Acid, Venous: 2.8 mmol/L (ref 0.5–1.9)

## 2022-07-11 LAB — APTT: aPTT: 31 seconds (ref 24–36)

## 2022-07-11 LAB — CK: Total CK: 56 U/L (ref 38–234)

## 2022-07-11 LAB — CULTURE, BLOOD (ROUTINE X 2)

## 2022-07-11 MED ORDER — ACETAMINOPHEN 325 MG PO TABS: 650.0000 mg | ORAL_TABLET | Freq: Once | ORAL | Status: DC

## 2022-07-11 MED ORDER — IOHEXOL 350 MG/ML SOLN
80.0000 mL | Freq: Once | INTRAVENOUS | Status: AC | PRN
  Administered 2022-07-11: 80 mL via INTRAVENOUS

## 2022-07-11 MED ORDER — SODIUM CHLORIDE 0.9 % IV SOLN: 2.0000 g | Freq: Two times a day (BID) | INTRAVENOUS | Status: DC

## 2022-07-11 MED ORDER — VANCOMYCIN HCL 750 MG/150ML IV SOLN
750.0000 mg | INTRAVENOUS | Status: DC
  Filled 2022-07-11: qty 150

## 2022-07-11 MED ORDER — VANCOMYCIN HCL IN DEXTROSE 1-5 GM/200ML-% IV SOLN
1000.0000 mg | Freq: Once | INTRAVENOUS | Status: AC
  Administered 2022-07-11: 1000 mg via INTRAVENOUS
  Filled 2022-07-11: qty 200

## 2022-07-11 MED ORDER — SODIUM CHLORIDE 0.9 % IV SOLN
2.0000 g | Freq: Once | INTRAVENOUS | Status: AC
  Administered 2022-07-11: 2 g via INTRAVENOUS
  Filled 2022-07-11: qty 12.5

## 2022-07-11 MED ORDER — LACTATED RINGERS IV BOLUS (SEPSIS)
1000.0000 mL | Freq: Once | INTRAVENOUS | Status: AC
  Administered 2022-07-11: 1000 mL via INTRAVENOUS

## 2022-07-11 MED ORDER — LIDOCAINE HCL (PF) 1 % IJ SOLN
30.0000 mL | Freq: Once | INTRAMUSCULAR | Status: AC
  Administered 2022-07-11: 30 mL
  Filled 2022-07-11: qty 30

## 2022-07-11 MED ORDER — ONDANSETRON HCL 4 MG/2ML IJ SOLN
4.0000 mg | Freq: Once | INTRAMUSCULAR | Status: AC
  Administered 2022-07-11: 4 mg via INTRAVENOUS
  Filled 2022-07-11: qty 2

## 2022-07-11 MED ORDER — LACTATED RINGERS IV SOLN: INTRAVENOUS | Status: DC

## 2022-07-11 MED ORDER — LIDOCAINE-EPINEPHRINE-TETRACAINE (LET) TOPICAL GEL
3.0000 mL | Freq: Once | TOPICAL | Status: AC
  Administered 2022-07-11: 3 mL via TOPICAL
  Filled 2022-07-11: qty 3

## 2022-07-11 NOTE — Progress Notes (Signed)
Pharmacy Antibiotic Note  Kristine Cox is a 82 y.o. female admitted on 07/11/2022 with unknown source of infection.  Pharmacy has been consulted for vancomycin and cefepime dosing.  Plan: Vancomycin 1000 mg IV x 1 dose. Vancomycin 750 mg IV every 24 hours. Cefepime 2000 mg IV every 24 hours. Monitor labs, c/s, and vanco levels as indicated.  Height: 5\' 3"  (160 cm) Weight: 56 kg (123 lb 7.3 oz) IBW/kg (Calculated) : 52.4  Temp (24hrs), Avg:96.9 F (36.1 C), Min:96.9 F (36.1 C), Max:96.9 F (36.1 C)  Recent Labs  Lab 07/11/22 1044  WBC 19.4*  CREATININE 0.71  LATICACIDVEN 3.4*    Estimated Creatinine Clearance: 44.8 mL/min (by C-G formula based on SCr of 0.71 mg/dL).    Allergies  Allergen Reactions   Succinylcholine Other (See Comments)    Unknown reaction     Antimicrobials this admission: Vanco 5/5 >> Cefepime 5/5 >>    Microbiology results: 5/5 BCx: pending   Thank you for allowing pharmacy to be a part of this patient's care.  Tad Moore 07/11/2022 1:18 PM

## 2022-07-11 NOTE — Discharge Instructions (Addendum)
You were seen today after a fall.  Your white blood cell count was high, but we found no infection on exam, CT scan or urinalysis. The lab abnormalities today, may have been due to falling and lying on the floor. Blood cultures were ordered and need to be followed up by [facility].  You had a large cut on your head. We cleaned this and sutured it, The sutures will need to be removed in about 5 days. Your PCP needs to follow up on your labs. The CT scan did show a renal cyst or mass. It is unlikely that anyone would further address this due to the advanced dementia, but wanted you to know in case they would follow up with another scan. Come back for any new or worsening symptoms.

## 2022-07-11 NOTE — ED Triage Notes (Signed)
[facility] reports pt coming from [facility]. Pt was found on the floor, staff does not know when her last normal was. Staff states she is more altered than normal, hx of dementia. No blood thinners.

## 2022-07-11 NOTE — ED Provider Notes (Signed)
[place] EMERGENCY DEPARTMENT AT [facility] Provider Note   CSN: [id number] Arrival date & time: 07/11/22  0915     History  Chief Complaint  Patient presents with   Kristine Cox is a 82 y.o. female.  She comes from [facility] by EMS.  She has PMH of dementia, hypertension, anxiety, GERD.  Found on floor this morning after apparent fall, unsure whether this happened and how long she is on the ground.  Noted to have large hematoma with laceration to left forehead. Denies DNR.  She is not able to provide any history due to dementia   Fall       Home Medications Prior to Admission medications   Medication Sig Start Date End Date Taking? Authorizing Provider  acetaminophen-codeine (TYLENOL #3) 300-30 MG tablet Take 1 tablet by mouth every 4 (four) hours as needed for moderate pain. 10/07/20   Adonis Huguenin, NP  divalproex (DEPAKOTE) 250 MG DR tablet Take 250 mg by mouth 2 (two) times daily. 07/02/16 07/02/17  [provider]  docusate sodium (COLACE) 100 MG capsule Take 100 mg by mouth daily.    [provider]  donepezil (ARICEPT) 10 MG tablet Take 10 mg by mouth at bedtime.    [provider]  ferrous fumarate-b12-vitamic C-folic acid (TRINSICON / FOLTRIN) capsule Take 1 capsule by mouth daily. 11/05/18   Rolly Salter, MD  FLUoxetine (PROZAC) 20 MG capsule Take 2 tablets daily 05/26/16   Van Clines, MD  memantine (NAMENDA) 10 MG tablet Take 10 mg by mouth 2 (two) times daily.    [provider]  nabumetone (RELAFEN) 750 MG tablet Take 750 mg by mouth daily.    [provider]  Omega-3 Fatty Acids (FISH OIL) 1000 MG CAPS Take 1 capsule by mouth 2 (two) times daily.    [provider]  pantoprazole (PROTONIX) 40 MG tablet Take 1 tablet (40 mg total) by mouth 2 (two) times daily before a meal. 11/04/18   Rolly Salter, MD  potassium chloride SA (K-DUR) 20 MEQ tablet Take 1 tablet (20 mEq total) by  mouth 2 (two) times daily. 11/04/18   Rolly Salter, MD  pravastatin (PRAVACHOL) 40 MG tablet Take 40 mg by mouth daily.    [provider]  QUEtiapine (SEROQUEL) 25 MG tablet Take 1 tablet (25 mg total) by mouth 2 (two) times daily. Patient taking differently: Take 25 mg by mouth at bedtime.  11/12/16 10/27/18  Plovsky, Earvin Hansen, MD  Vitamin D, Cholecalciferol, 25 MCG (1000 UT) TABS Take 2 tablets by mouth daily.    [provider]      Allergies    Succinylcholine    Review of Systems   Review of Systems  Physical Exam Updated Vital Signs BP 100/85   Pulse 73   Temp (!) 96.9 F (36.1 C) (Axillary)   Resp 18   Ht 5\' 3"  (1.6 m)   Wt 56 kg   SpO2 90%   BMI 21.87 kg/m  Physical Exam Constitutional:      Appearance: She is ill-appearing.  HENT:     Head:      Mouth/Throat:     Mouth: Mucous membranes are moist.  Eyes:     Extraocular Movements: Extraocular movements intact.     Pupils: Pupils are equal, round, and reactive to light.  Cardiovascular:     Rate and Rhythm: Normal rate and regular rhythm.  Pulmonary:  Effort: Pulmonary effort is normal.  Abdominal:     General: Abdomen is flat.     Palpations: Abdomen is soft.  Neurological:     Mental Status: She is disoriented.     GCS: GCS eye subscore is 2. GCS verbal subscore is 3. GCS motor subscore is 5.     Cranial Nerves: No facial asymmetry.     ED Results / Procedures / Treatments   Labs (all labs ordered are listed, but only abnormal results are displayed) Labs Reviewed  LACTIC ACID, PLASMA - Abnormal; Notable for the following components:      Result Value   Lactic Acid, Venous 3.4 (*)    All other components within normal limits  LACTIC ACID, PLASMA - Abnormal; Notable for the following components:   Lactic Acid, Venous 2.8 (*)    All other components within normal limits  COMPREHENSIVE METABOLIC PANEL - Abnormal; Notable for the following components:   Glucose, Bld 123 (*)    All  other components within normal limits  CBC WITH DIFFERENTIAL/PLATELET - Abnormal; Notable for the following components:   WBC 19.4 (*)    Neutro Abs 16.0 (*)    Abs Immature Granulocytes 0.29 (*)    All other components within normal limits  URINALYSIS, W/ REFLEX TO CULTURE (INFECTION SUSPECTED) - Abnormal; Notable for the following components:   APPearance HAZY (*)    Bacteria, UA RARE (*)    All other components within normal limits  CBG MONITORING, ED - Abnormal; Notable for the following components:   Glucose-Capillary 126 (*)    All other components within normal limits  CULTURE, BLOOD (ROUTINE X 2)  CULTURE, BLOOD (ROUTINE X 2)  SARS CORONAVIRUS 2 BY RT PCR  PROTIME-INR  APTT  CK    EKG EKG Interpretation  Date/Time:  Sunday Jul 11 2022 11:18:28 EDT Ventricular Rate:  88 PR Interval:  136 QRS Duration: 90 QT Interval:  386 QTC Calculation: 467 R Axis:   55 Text Interpretation: Sinus rhythm Nonspecific T wave abnormality Confirmed by Cathren Laine ([id number]) on 07/11/2022 11:37:55 AM  Radiology CT Chest W Contrast  Result Date: 07/11/2022 CLINICAL DATA:  Sepsis.  Altered mental status. EXAM: CT CHEST, ABDOMEN, AND PELVIS WITH CONTRAST TECHNIQUE: Multidetector CT imaging of the chest, abdomen and pelvis was performed following the standard protocol during bolus administration of intravenous contrast. RADIATION DOSE REDUCTION: This exam was performed according to the departmental dose-optimization program which includes automated exposure control, adjustment of the mA and/or kV according to patient size and/or use of iterative reconstruction technique. CONTRAST:  80mL OMNIPAQUE IOHEXOL 350 MG/ML SOLN COMPARISON:  None Available. FINDINGS: CT CHEST FINDINGS Cardiovascular: No acute findings. Aortic and coronary atherosclerotic calcification incidentally noted. Mediastinum/Lymph Nodes: No masses or pathologically enlarged lymph nodes identified. Lungs/Pleura: Eventration or hernia of  the right hemidiaphragm is noted. No suspicious pulmonary nodules or masses identified. Chronic interstitial lung disease is seen peripheral predominance. No evidence of infiltrate or pleural effusion. Musculoskeletal:  No suspicious bone lesions identified. CT ABDOMEN AND PELVIS FINDINGS Hepatobiliary: No masses identified. Pancreas:  No mass or inflammatory changes. Spleen:  Within normal limits in size and appearance. Adrenals/Urinary tract: 1.3 cm subcapsular lesion is seen in medial upper pole of left kidney which has higher than fluid attenuation. This could represent a hemorrhagic cyst or solid renal neoplasm. No evidence of hydronephrosis. Stomach/Bowel: No evidence of obstruction, inflammatory process, or abnormal fluid collections. Vascular/Lymphatic: No pathologically enlarged lymph nodes identified. No acute vascular findings. Aortic  atherosclerotic calcification incidentally noted. Reproductive: Prior hysterectomy noted. Adnexal regions are unremarkable in appearance. Other:  None. Musculoskeletal:  No suspicious bone lesions identified. IMPRESSION: No evidence of abscess or other acute findings. 1.3 cm indeterminate lesion in upper pole of left kidney, which could represent a hemorrhagic cyst or solid renal neoplasm. Renal protocol abdomen CT or MRI without and with contrast is recommended for further characterization. Chronic interstitial lung disease. Aortic Atherosclerosis (ICD10-I70.0). Electronically Signed   By: Danae Orleans M.D.   On: 07/11/2022 13:14   CT ABDOMEN PELVIS W CONTRAST  Result Date: 07/11/2022 CLINICAL DATA:  Sepsis.  Altered mental status. EXAM: CT CHEST, ABDOMEN, AND PELVIS WITH CONTRAST TECHNIQUE: Multidetector CT imaging of the chest, abdomen and pelvis was performed following the standard protocol during bolus administration of intravenous contrast. RADIATION DOSE REDUCTION: This exam was performed according to the departmental dose-optimization program which includes  automated exposure control, adjustment of the mA and/or kV according to patient size and/or use of iterative reconstruction technique. CONTRAST:  80mL OMNIPAQUE IOHEXOL 350 MG/ML SOLN COMPARISON:  None Available. FINDINGS: CT CHEST FINDINGS Cardiovascular: No acute findings. Aortic and coronary atherosclerotic calcification incidentally noted. Mediastinum/Lymph Nodes: No masses or pathologically enlarged lymph nodes identified. Lungs/Pleura: Eventration or hernia of the right hemidiaphragm is noted. No suspicious pulmonary nodules or masses identified. Chronic interstitial lung disease is seen peripheral predominance. No evidence of infiltrate or pleural effusion. Musculoskeletal:  No suspicious bone lesions identified. CT ABDOMEN AND PELVIS FINDINGS Hepatobiliary: No masses identified. Pancreas:  No mass or inflammatory changes. Spleen:  Within normal limits in size and appearance. Adrenals/Urinary tract: 1.3 cm subcapsular lesion is seen in medial upper pole of left kidney which has higher than fluid attenuation. This could represent a hemorrhagic cyst or solid renal neoplasm. No evidence of hydronephrosis. Stomach/Bowel: No evidence of obstruction, inflammatory process, or abnormal fluid collections. Vascular/Lymphatic: No pathologically enlarged lymph nodes identified. No acute vascular findings. Aortic atherosclerotic calcification incidentally noted. Reproductive: Prior hysterectomy noted. Adnexal regions are unremarkable in appearance. Other:  None. Musculoskeletal:  No suspicious bone lesions identified. IMPRESSION: No evidence of abscess or other acute findings. 1.3 cm indeterminate lesion in upper pole of left kidney, which could represent a hemorrhagic cyst or solid renal neoplasm. Renal protocol abdomen CT or MRI without and with contrast is recommended for further characterization. Chronic interstitial lung disease. Aortic Atherosclerosis (ICD10-I70.0). Electronically Signed   By: Danae Orleans M.D.    On: 07/11/2022 13:14   CT Cervical Spine Wo Contrast  Result Date: 07/11/2022 CLINICAL DATA:  82 year old female found down.  Possible sepsis. EXAM: CT CERVICAL SPINE WITHOUT CONTRAST TECHNIQUE: Multidetector CT imaging of the cervical spine was performed without intravenous contrast. Multiplanar CT image reconstructions were also generated. RADIATION DOSE REDUCTION: This exam was performed according to the departmental dose-optimization program which includes automated exposure control, adjustment of the mA and/or kV according to patient size and/or use of iterative reconstruction technique. COMPARISON:  Head CT today.  Cervical spine CT 10/27/2018. FINDINGS: Alignment: Mild straightening of cervical lordosis since 2020, superimposed on chronic degenerative appearing anterolisthesis at C3-C4 and C7-T1. T1-T2 and C4-C5 anterolisthesis appears progressed since 2020, with associated facet degeneration. Skull base and vertebrae: Visualized skull base is intact. No atlanto-occipital dissociation. C1 and C2 appear intact and aligned. No acute osseous abnormality identified. Soft tissues and spinal canal: No prevertebral fluid or swelling. No visible canal hematoma. Negative for age visible noncontrast neck soft tissues. Disc levels: Degenerative ankylosis of C3-C4 has occurred since 2020.  And C4-C5 degenerative facet and low CIS on the right is also new. Advanced chronic disc and endplate degeneration at those levels through C6-C7. Up to mild associated cervical spinal stenosis. Upper chest: Upper lung atelectasis. Grossly intact visible upper thoracic levels. Calcified aortic atherosclerosis. IMPRESSION: 1. No acute traumatic injury identified in the cervical spine. 2. Progressed cervical spine degeneration since 2020, including new degenerative ankylosis of C3-C4 and C4-C5. Up to mild associated cervical spinal stenosis. Electronically Signed   By: Odessa Fleming M.D.   On: 07/11/2022 10:20   DG Chest Port 1  View  Result Date: 07/11/2022 CLINICAL DATA:  82 year old female found down.  Possible sepsis. EXAM: PORTABLE CHEST 1 VIEW COMPARISON:  Portable chest 11/03/2018 and earlier. FINDINGS: AP portable view at 0959 hours. Lower lung volumes. Mediastinal contours are stable and within normal limits. Crowding of markings superimposed on some chronic bilateral mid lung increased interstitial opacity. No pneumothorax, pleural effusion, consolidation or definite acute lung opacity. Stable cholecystectomy clips.  Negative visible bowel gas. Mild mid thoracic compression fracture, approximately T8, is new since 2020. Otherwise Stable visualized osseous structures. IMPRESSION: 1. Very low lung volumes.  No acute cardiopulmonary abnormality. 2. A mild thoracic compression fracture, approximately T8, is new since 2020 and age indeterminate. Electronically Signed   By: Odessa Fleming M.D.   On: 07/11/2022 10:17   CT Head Wo Contrast  Result Date: 07/11/2022 CLINICAL DATA:  82 year old female found down. EXAM: CT HEAD WITHOUT CONTRAST TECHNIQUE: Contiguous axial images were obtained from the base of the skull through the vertex without intravenous contrast. RADIATION DOSE REDUCTION: This exam was performed according to the departmental dose-optimization program which includes automated exposure control, adjustment of the mA and/or kV according to patient size and/or use of iterative reconstruction technique. COMPARISON:  Head CT 10/27/2018.  Brain MRI 06/24/2016. FINDINGS: Brain: Cerebral volume not significantly changed since 2020, along with nonspecific ventricular enlargement. Fourth ventricle spared as before. No midline shift, mass effect, or evidence of intracranial mass lesion. No acute intracranial hemorrhage identified. No cortically based acute infarct identified. Mild for age scattered white matter hypodensity not significantly changed. Vascular: Calcified atherosclerosis at the skull base. No suspicious intracranial  vascular hyperdensity. Skull: No acute osseous abnormality identified. Sinuses/Orbits: Visualized paranasal sinuses and mastoids are stable and well aerated. Other: Broad-based left forehead scalp hematoma tracking cephalad, up to 12 mm in thickness. Underlying left frontal bone and frontal sinus appear intact. Left orbits soft tissues relatively spared. IMPRESSION: 1. Left forehead scalp hematoma without underlying skull fracture. 2. No acute intracranial abnormality. Stable non contrast CT appearance of the brain since 2020. Electronically Signed   By: Odessa Fleming M.D.   On: 07/11/2022 10:15    Procedures .Marland KitchenLaceration Repair  Date/Time: 07/11/2022 4:08 PM  Performed by: Ma Rings, PA-C Authorized by: Ma Rings, PA-C   Consent:    Consent obtained:  Verbal   Consent given by:  Healthcare agent   Risks, benefits, and alternatives were discussed: yes     Risks discussed:  Infection, pain and poor cosmetic result   Alternatives discussed:  No treatment Laceration details:    Location:  Face   Face location:  Forehead   Length (cm):  4 Exploration:    Hemostasis achieved with:  LET and direct pressure   Wound exploration: entire depth of wound visualized     Contaminated: no   Treatment:    Area cleansed with:  Chlorhexidine   Amount of cleaning:  Standard  Irrigation solution:  Sterile saline   Irrigation method:  Syringe   Visualized foreign bodies/material removed: no     Debridement:  Minimal Skin repair:    Repair method:  Sutures   Suture size:  4-0 and 5-0   Suture material:  Prolene   Number of sutures:  7 Approximation:    Approximation:  Close Repair type:    Repair type:  Simple Post-procedure details:    Dressing:  Antibiotic ointment   Procedure completion:  Tolerated well, no immediate complications     Medications Ordered in ED Medications  lactated ringers infusion ( Intravenous New Bag/Given 07/11/22 1203)  ondansetron (ZOFRAN) injection 4 mg  (has no administration in time range)  ceFEPIme (MAXIPIME) 2 g in sodium chloride 0.9 % 100 mL IVPB (has no administration in time range)  vancomycin (VANCOREADY) IVPB 750 mg/150 mL (has no administration in time range)  lidocaine (PF) (XYLOCAINE) 1 % injection 30 mL (has no administration in time range)  acetaminophen (TYLENOL) tablet 650 mg (has no administration in time range)  ceFEPIme (MAXIPIME) 2 g in sodium chloride 0.9 % 100 mL IVPB (0 g Intravenous Stopped 07/11/22 1232)  vancomycin (VANCOCIN) IVPB 1000 mg/200 mL premix (0 mg Intravenous Stopped 07/11/22 1300)  lactated ringers bolus 1,000 mL (0 mLs Intravenous Stopped 07/11/22 1300)  iohexol (OMNIPAQUE) 350 MG/ML injection 80 mL (80 mLs Intravenous Contrast Given 07/11/22 1228)  lidocaine-EPINEPHrine-tetracaine (LET) topical gel (3 mLs Topical Given 07/11/22 1429)    ED Course/ Medical Decision Making/ A&P Clinical Course as of 07/11/22 1615  Sun Jul 11, 2022  0941 Called to room by nurse to evaluate patient shortly after EMS left.  Patient was noted to be hypotensive initial blood pressure was approximately 60/40, then 40/20.  Patient was withdrawing to pain.  IV started, waiting on Accu-Chek, being given fluids, will get CT head and labs.  Patient is cool to touch. She opens her eyes to pain, and moans. She has DNR paperwork at bedside  [CB]  1043 Head and C-spine did not show any acute traumatic findings, chest x-ray showed age-indeterminate T8 compression deformity.  Awaiting labs.  Patient temperature was somewhat low so several warm blankets placed on her, and we will continue to monitor. [CB]  1138 Labs returned showed a white count of 19, lactic acid elevated at 3.4.  Sepsis is suspected, given broad-spectrum antibiotics at this time.  Urine is still pending, unsure of source. [CB]  1146 Discussed with patient's Sister Kathalene Frames.  She confirmed that patient's baseline is disoriented, is not able to answer any questions at baseline.   Agreeable with hospitalization and confirmed patient is DNR but they do want treatment.  She is going to come to the hospital as soon as she is able. [CB]  [id number] Patient is alert, eyes open to voice, seems to be back to her baseline.  Imaging did not reveal any source of infection, UA is normal.  Suspect lab analysis may have been from laying on the floor and being exposed.  Lactic acid is trending down after IV fluids.  Discussed with provider at [facility], given reassuring imaging and much improved vitals after fluids we will likely send her back.  She states they can follow-up on her blood cultures later, she has a nurse practitioner he will be able to see her tomorrow.  Plan at this time is to repair her laceration, recheck her temperature and likely discharge. [CB]    Clinical Course User Index [CB] Cristi Loron,  Elray Mcgregor, PA-C                             Medical Decision Making DDX: concussion, skull fracture, ICH, concussion, sepsis, other ED course:: Course documented above but briefly patient brought in via EMS after being found on the ground at her nursing home.  Family reports patient is bedbound at baseline, disoriented at baseline and spends over time sleeping.  Unsure of how long patient was on the ground last night was altered and hypotensive.  She is very fluid responsive, imaging was reassuring.  She did have a significant leukocytosis and had a lactic acidosis but normal CO2, no anion gap, after fluids she was back to her baseline, blood pressure and mental status.  Repeat lactate shows it is trending down.  Feel her lab abnormalities and vital abnormalities were due to laying on the floor uncovered for unknown amount of time.  Patient was also evaluated by my attending Dr. Denton Lank.  As above I discussed the care with the nurse practitioner on-call at [facility] as well.  They feel is appropriate to send her back they can follow-up blood cultures including the potentially treat with  antibiotics at the facility if they came back positive.  She is stable to go back.  Discussed with patient's daughter and daughter and they are comfortable with her going back.  They agreed that she would do better in a familiar setting given that there would be no significant benefit at this time to hospitalization.  Amount and/or Complexity of Data Reviewed Labs: ordered. Radiology: ordered. ECG/medicine tests: ordered.  Risk Prescription drug management.           Final Clinical Impression(s) / ED Diagnoses Final diagnoses:  Injury of head, initial encounter  Facial laceration, initial encounter    Rx / DC Orders ED Discharge Orders     None         Ma Rings, PA-C 07/11/22 1615    Cathren Laine, MD 07/13/22 1250

## 2022-07-11 NOTE — ED Notes (Signed)
Date and time results received: 07/11/22 1118 (use smartphrase ".now" to insert current time)  Test: Lactic acid Critical Value: 3.4  Name of Provider Notified: Dr. Denton Lank  Orders Received? Or Actions Taken?:

## 2022-07-11 NOTE — ED Notes (Signed)
Family updated as to patient's status.

## 2022-07-11 NOTE — Sepsis Progress Note (Addendum)
Elink following code sepsis  1330 Notified bedside nurse of need to draw repeat lactic acid.

## 2022-07-13 LAB — CULTURE, BLOOD (ROUTINE X 2)

## 2022-07-14 LAB — CULTURE, BLOOD (ROUTINE X 2)

## 2022-07-15 LAB — CULTURE, BLOOD (ROUTINE X 2): Culture: NO GROWTH

## 2022-07-16 LAB — CULTURE, BLOOD (ROUTINE X 2)
Culture: NO GROWTH
Special Requests: ADEQUATE
Special Requests: ADEQUATE
